# Patient Record
Sex: Female | Born: 2000 | Race: Black or African American | Hispanic: No | Marital: Single | Smoking: Never smoker
Health system: Southern US, Community
[De-identification: ages and names within clinical notes are randomized; demographics above are authoritative.]

## PROBLEM LIST (undated history)

## (undated) DIAGNOSIS — Z8489 Family history of other specified conditions: Secondary | ICD-10-CM

## (undated) DIAGNOSIS — D649 Anemia, unspecified: Secondary | ICD-10-CM

## (undated) DIAGNOSIS — R87629 Unspecified abnormal cytological findings in specimens from vagina: Secondary | ICD-10-CM

## (undated) DIAGNOSIS — L309 Dermatitis, unspecified: Secondary | ICD-10-CM

## (undated) DIAGNOSIS — Z87898 Personal history of other specified conditions: Secondary | ICD-10-CM

## (undated) DIAGNOSIS — D242 Benign neoplasm of left breast: Secondary | ICD-10-CM

## (undated) HISTORY — DX: Dermatitis, unspecified: L30.9

## (undated) HISTORY — DX: Anemia, unspecified: D64.9

## (undated) HISTORY — DX: Unspecified abnormal cytological findings in specimens from vagina: R87.629

---
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
## 2021-01-09 NOTE — Progress Notes (Signed)
Patient ID: CHANETTE DEMO, female   DOB: 2000-10-20, 20 y.o.   MRN: [id number] I have reviewed information documented by RN and was present in the office for consultation as indicated.  Agree with assessment and plan noted in record of visit. Lurlean Leyden, MD

## 2021-02-03 ENCOUNTER — Ambulatory Visit (INDEPENDENT_AMBULATORY_CARE_PROVIDER_SITE_OTHER): Payer: Medicaid Other | Admitting: Pediatrics

## 2021-02-03 ENCOUNTER — Other Ambulatory Visit: Payer: Self-pay

## 2021-02-03 ENCOUNTER — Encounter: Payer: Self-pay | Admitting: Pediatrics

## 2021-02-03 VITALS — Wt 105.0 lb

## 2021-02-03 DIAGNOSIS — N946 Dysmenorrhea, unspecified: Secondary | ICD-10-CM | POA: Diagnosis not present
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
## 2021-02-04 DIAGNOSIS — N946 Dysmenorrhea, unspecified: Secondary | ICD-10-CM | POA: Diagnosis not present

## 2021-02-04 NOTE — Patient Instructions (Signed)
I will contact you with test results

## 2021-02-07 DIAGNOSIS — N921 Excessive and frequent menstruation with irregular cycle: Secondary | ICD-10-CM

## 2021-02-07 LAB — WET PREP BY MOLECULAR PROBE
Candida species: NOT DETECTED
Gardnerella vaginalis: NOT DETECTED
MICRO NUMBER:: 12022144
SPECIMEN QUALITY:: ADEQUATE
Trichomonas vaginosis: NOT DETECTED

## 2021-02-07 LAB — C. TRACHOMATIS/N. GONORRHOEAE RNA
C. trachomatis RNA, TMA: NOT DETECTED
N. gonorrhoeae RNA, TMA: NOT DETECTED

## 2021-02-07 MED ORDER — AYGESTIN 5 MG PO TABS: ORAL_TABLET | ORAL | 0 refills | Status: DC

## 2021-02-10 ENCOUNTER — Ambulatory Visit (HOSPITAL_COMMUNITY)
Admission: EM | Admit: 2021-02-10 | Discharge: 2021-02-10 | Disposition: A | Discharge status: Home / Self Care | Payer: Medicaid Other | Attending: Family Medicine | Admitting: Family Medicine

## 2021-02-10 ENCOUNTER — Encounter (HOSPITAL_COMMUNITY): Payer: Self-pay

## 2021-02-10 ENCOUNTER — Other Ambulatory Visit: Payer: Self-pay

## 2021-02-10 DIAGNOSIS — N939 Abnormal uterine and vaginal bleeding, unspecified: Secondary | ICD-10-CM | POA: Insufficient documentation

## 2021-02-10 DIAGNOSIS — J039 Acute tonsillitis, unspecified: Secondary | ICD-10-CM | POA: Diagnosis not present

## 2021-02-10 LAB — POCT RAPID STREP A, ED / UC: Streptococcus, Group A Screen (Direct): NEGATIVE

## 2021-02-10 MED ORDER — NORETHINDRONE ACETATE 5 MG PO TABS: 5.0000 mg | ORAL_TABLET | Freq: Every day | ORAL | 3 refills | Status: DC

## 2021-02-10 MED ORDER — AMOXICILLIN 500 MG PO TABS: 500.0000 mg | ORAL_TABLET | Freq: Two times a day (BID) | ORAL | 0 refills | Status: AC

## 2021-02-10 MED ORDER — AMOXICILLIN 500 MG PO TABS: 500.0000 mg | ORAL_TABLET | Freq: Two times a day (BID) | ORAL | 0 refills | Status: DC

## 2021-02-10 NOTE — Discharge Instructions (Signed)
I have sent in amoxicillin for you to take twice a day for 7 days  I have given you a paper prescription for your birth control pills.  Follow up with this office or with primary care if symptoms are persisting.  Follow up in the ER for high fever, trouble swallowing, trouble breathing, other concerning symptoms.

## 2021-02-10 NOTE — ED Provider Notes (Signed)
[facility]   [id number] 02/10/21 Arrival Time: 1950  DT:OIZT THROAT  SUBJECTIVE: History from: patient.  [name] is a 20 y.o. female who presents with abrupt onset of sore throat for the last 2 days.  Denies sick exposure to Covid, strep, flu or mono, or precipitating event. Has tried motrin without relief. Has negative history of Covid. Has not completed Covid vaccines. Symptoms are made worse with swallowing, but tolerating liquids and own secretions without difficulty.  Denies previous symptoms in the past.  Denies fever, chills, fatigue, ear pain, sinus pain, rhinorrhea, nasal congestion, cough, SOB, wheezing, chest pain, nausea, rash, changes in bowel or bladder habits.    Of note, reports breakthrough bleeding on depo-provera, was given rx by PCP but pharmacy still does not have the script per patient  ROS: As per HPI.  All other pertinent ROS negative.     Past Medical History:  Diagnosis Date   Asthma    prn inhaler/neb.   Family history of adverse reaction to anesthesia    pt's mother has hx. of post-op N/V and hx. of being hard to wake up post-op   Fibroadenoma of breast, left    History of febrile seizure    Past Surgical History:  Procedure Laterality Date   MASS EXCISION Left 07/20/2016   Procedure: EXCISION left breast fibroadenoma;  Surgeon: Gerald Stabs, MD;  Location: [facility];  Service: General;  Laterality: Left;   Allergies  Allergen Reactions   Peanut-Containing Drug Products Hives and Shortness Of Breath   No current facility-administered medications on file prior to encounter.   Current Outpatient Medications on File Prior to Encounter  Medication Sig Dispense Refill   ALBUTEROL IN Inhale into the lungs.     Montelukast Sodium (SINGULAIR PO) Take by mouth.     Social History   Socioeconomic History   Marital status: Single    Spouse name: Not on file   Number of children: Not on file   Years of  education: Not on file   Highest education level: Not on file  Occupational History   Not on file  Tobacco Use   Smoking status: Never   Smokeless tobacco: Never  Vaping Use   Vaping Use: Never used  Substance and Sexual Activity   Alcohol use: No   Drug use: No   Sexual activity: Yes  Other Topics Concern   Not on file  Social History Narrative         Social Determinants of Health   Financial Resource Strain: Not on file  Food Insecurity: Not on file  Transportation Needs: Not on file  Physical Activity: Not on file  Stress: Not on file  Social Connections: Not on file  Intimate Partner Violence: Not on file   Family History  Problem Relation Age of Onset   Hypertension Mother    Asthma Mother    Anesthesia problems Mother        post-op N/V; hard to wake up post-op   Hypertension Maternal Uncle    Kidney disease Maternal Uncle        dialysis   Diabetes Maternal Grandmother    Heart disease Maternal Grandmother    Hypertension Maternal Grandmother    Kidney disease Maternal Grandmother        not yet on dialysis   Congestive Heart Failure Maternal Grandmother    Asthma Maternal Grandmother    Kidney disease Maternal Uncle     OBJECTIVE:  Vitals:  02/10/21 1358  BP: 119/71  Pulse: 77  Resp: 16  Temp: 99.1 F (37.3 C)  TempSrc: Oral  SpO2: 100%     General appearance: alert; appears fatigued, but nontoxic, speaking in full sentences and managing own secretions HEENT: NCAT; Ears: EACs clear, TMs pearly gray with visible cone of light, without erythema; Eyes: PERRL, EOMI grossly; Nose: no obvious rhinorrhea; Throat: oropharynx erythematous, tonsils 2+ and mildly erythematous with white tonsillar exudates, uvula midline Neck: supple with LAD Lungs: CTA bilaterally without adventitious breath sounds; cough absent Heart: regular rate and rhythm.  Radial pulses 2+ symmetrical bilaterally Skin: warm and dry Psychological: alert and cooperative; normal  mood and affect  LABS: Results for orders placed or performed during the hospital encounter of 02/10/21 (from the past 24 hour(s))  POCT Rapid Strep A     Status: None   Collection Time: 02/10/21  2:59 PM  Result Value Ref Range   Streptococcus, Group A Screen (Direct) NEGATIVE NEGATIVE     ASSESSMENT & PLAN:  1. Acute tonsillitis, unspecified etiology   2. Abnormal vaginal bleeding     Meds ordered this encounter  Medications   norethindrone (AYGESTIN) 5 MG tablet    Sig: Take 1 tablet (5 mg total) by mouth daily for 28 days.    Dispense:  28 tablet    Refill:  3    Order Specific Question:   Supervising Provider    Answer:   Chase Picket [[id number]]   DISCONTD: amoxicillin (AMOXIL) 500 MG tablet    Sig: Take 1 tablet (500 mg total) by mouth 2 (two) times daily for 10 days.    Dispense:  20 tablet    Refill:  0    Order Specific Question:   Supervising Provider    Answer:   Chase Picket [[id number]]   amoxicillin (AMOXIL) 500 MG tablet    Sig: Take 1 tablet (500 mg total) by mouth 2 (two) times daily for 10 days.    Dispense:  20 tablet    Refill:  0    Order Specific Question:   Supervising Provider    Answer:   Chase Picket [id number]   Strep test negative, will send out for culture and we will call you with results Will cover for tonsillitis with amoxicillin 500mg  BID x 10 days given clinical presentation Declines test for mono at this time Get plenty of rest and push fluids Take OTC Zyrtec and use chloraseptic spray as needed for throat pain. Drink warm or cool liquids, use throat lozenges, or popsicles to help alleviate symptoms Take OTC ibuprofen or tylenol as needed for pain Follow up with PCP if symptoms persists Return or go to ER if patient has any new or worsening symptoms such as fever, chills, nausea, vomiting, worsening sore throat, cough, abdominal pain, chest pain, changes in bowel or bladder habits Provided paper script for OCPs since she has  not yet been able to obtain these Reviewed expectations re: course of current medical issues. Questions answered. Outlined signs and symptoms indicating need for more acute intervention. Patient verbalized understanding. After Visit Summary given.           Faustino Congress, NP 02/10/21 1537

## 2021-02-10 NOTE — ED Triage Notes (Signed)
Patient presents to Urgent Care with complaints of sore throat, abdominal pain since last Tuesday. Treating symptoms with motrin and cough med.   Denies fever.

## 2021-02-13 LAB — CULTURE, GROUP A STREP (THRC)

## 2021-02-23 DIAGNOSIS — F4321 Adjustment disorder with depressed mood: Secondary | ICD-10-CM | POA: Diagnosis not present

## 2021-03-22 DIAGNOSIS — F4321 Adjustment disorder with depressed mood: Secondary | ICD-10-CM | POA: Diagnosis not present

## 2021-03-30 DIAGNOSIS — F4321 Adjustment disorder with depressed mood: Secondary | ICD-10-CM | POA: Diagnosis not present

## 2021-03-30 NOTE — Progress Notes (Signed)
Adolescent Well Care Visit [name] is a 20 y.o. female who is here for well care.    PCP:  Lurlean Leyden, MD   History was provided by the patient.  Confidentiality was discussed with the patient and, if applicable, with caregiver as well. Patient's personal or confidential phone number: [phone]   Current Issues: Current concerns include  Chief Complaint  Patient presents with   Well Child    She doesn't want depo shot she wants some thing else.  irregular bleeding,   Medication Refill    Eczema cream, refill on the norethrindrone   Based on conversation with Dr. Dorothyann Peng in June 2022, she would like the implant.  Will refer her to adolescent medicine Wants to stop Depo-provera.  Bleeding 3-4 months straight which is why she wants a different form of birth control .   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 They had been together 2 years.  She is in counseling.  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  no Stressors of note: yes - loss of her boyfriend of 2 years  Education: School Name: [employer] performance: doing well; no concerns  Menstruation:   Patient's last menstrual period was 03/30/2021. Menstrual History: Heavy menses  Confidential Social History: Tobacco?  yes Secondhand  smoke exposure?  no Drugs/ETOH?  no  Sexually Active?  yes   Pregnancy Prevention: Discussed  Safe at home, in school & in relationships?  Yes Safe to self?  Yes   Screenings: Patient has a dental home: yes  The patient completed the Rapid Assessment for Adolescent Preventive Services screening questionnaire and the following topics were identified as risk factors and discussed: healthy eating, exercise, seatbelt use, weapon use, tobacco use, marijuana use, drug use, birth control, and mental health issues  In addition, the following topics were discussed as part of anticipatory guidance birth control.  PHQ-9 completed and results indicated see screening tab   Physical Exam:  Vitals:   03/31/21 1501  BP: 110/64  Pulse: 71  SpO2: 97%  Weight: 106 lb 9.6 oz (48.4 kg)  Height: 5' 0.32" (1.532 m)   BP 110/64 (BP Location: Right Arm, Patient Position: Sitting, Cuff Size: Small)   Pulse 71   Ht 5' 0.32" (1.532 m)   Wt 106 lb 9.6 oz (48.4 kg)   LMP 03/30/2021 Comment: she has irregular bleeding  SpO2 97%   BMI 20.60 kg/m  Body mass index: body mass index is 20.6 kg/m. Blood pressure percentiles are not available for patients who are 18 years or older.  Hearing Screening  Method: Audiometry   '500Hz'$  '1000Hz'$  '2000Hz'$  '4000Hz'$   Right ear '20 20 20 20  '$ Left ear '20 20 20 20   '$ Vision Screening   Right eye Left eye Both eyes  Without correction     With correction 2016  20/16 20/16    General Appearance:   alert, oriented, no acute distress and slender  HENT: Normocephalic, no obvious abnormality, conjunctiva clear  Mouth:   Normal appearing teeth, no obvious discoloration, dental caries, or dental caps  Neck:   Supple; thyroid: no enlargement, symmetric, no tenderness/mass/nodules     Lungs:   Clear to auscultation bilaterally, normal work of breathing  Heart:   Regular rate and rhythm, S1 and S2 normal, no murmurs;   Abdomen:   Soft, non-tender, no mass, or organomegaly  GU  normal female external genitalia, pelvic not performed  Musculoskeletal:   Tone and strength strong and symmetrical, all extremities               Lymphatic:   No cervical adenopathy  Skin/Hair/Nails:   Skin warm, dry and intact, no rashes, no bruises or petechiae  Neurologic:   Strength, gait, and coordination normal and age-appropriate CN II -XII grossly intact     Assessment and Plan:   1. Encounter for general adult medical examination with abnormal findings -In counseling after boyfriend of 2 years' recent death in [place]  - Discussion about adolescent transition to adult practice.  Based on information today we did not complete the adolescent transition assessment as she had too many other pressing issues to discuss. We agreed that she would follow up with Dr. Dorothyann Peng in ~ 3 months and that could be one issue that they discuss.  With referral to Adolescent medicine, she could be referred to adult practice when appropriate and still be followed for contraception with adolescent med should she chose that option.    2. Body mass index, pediatric, 5th percentile to less than 85th percentile for age 5 regarding 5-2-1-0 goals of healthy active living including:  - eating at least 5 fruits and vegetables a day - at least 1 hour of activity - no sugary beverages - eating three meals each day with age-appropriate servings - age-appropriate screen time - age-appropriate sleep patterns    3. Screening examination for venereal disease - Urine cytology ancillary only - pending  > 20 minutes spent talking about # 4 and options for birth control, heavy menses, adolescent transition, grief with boyfriend's recent death and stressors associated with cosmetology school.   4. Breakthrough bleeding on depo provera Reporting heavy bleeding for weeks on Depo provera and therefore has decided not to continue with Depo.  She would like to get the implant after speaking with Dr. Dorothyann Peng in June  2022.  No referral made at that time so will make referral today.  Discussed options until able to see adolescent med.  She did not want a depo shot today.  She declined other possible birth control such as OCP's.  She said she just planned to abstain.  Discussed Plan B if needed.  She is aware that likely will not get an appt before late Sept with Adolescent Med - Ambulatory referral to Adolescent Medicine  Vaccine:  She has not had the HPV vaccine.  Discussed the vaccine, what it protects against and that she would receive a series of 3 vaccines, since starting after age 20 years.  She would like to think about it and follow up in the future.   BMI is appropriate for age  Hearing screening result:normal Vision screening result: normal  Counseling provided for all of the vaccine components  Orders Placed This Encounter  Procedures   POCT urine pregnancy     No follow-ups on file.Mickel Baas  Magdalen Spatz, NP

## 2021-03-31 ENCOUNTER — Encounter: Payer: Self-pay | Admitting: Pediatrics

## 2021-03-31 ENCOUNTER — Other Ambulatory Visit (HOSPITAL_COMMUNITY)
Admission: RE | Admit: 2021-03-31 | Discharge: 2021-03-31 | Disposition: A | Discharge status: Home / Self Care | Payer: Medicaid Other | Source: Ambulatory Visit | Attending: Pediatrics | Admitting: Pediatrics

## 2021-03-31 ENCOUNTER — Ambulatory Visit (INDEPENDENT_AMBULATORY_CARE_PROVIDER_SITE_OTHER): Payer: Medicaid Other | Admitting: Pediatrics

## 2021-03-31 ENCOUNTER — Other Ambulatory Visit: Payer: Self-pay

## 2021-03-31 VITALS — BP 110/64 | HR 71 | Ht 60.32 in | Wt 106.6 lb

## 2021-03-31 DIAGNOSIS — Z113 Encounter for screening for infections with a predominantly sexual mode of transmission: Secondary | ICD-10-CM | POA: Insufficient documentation

## 2021-03-31 DIAGNOSIS — Z68.41 Body mass index (BMI) pediatric, 5th percentile to less than 85th percentile for age: Secondary | ICD-10-CM | POA: Diagnosis not present

## 2021-03-31 DIAGNOSIS — N921 Excessive and frequent menstruation with irregular cycle: Secondary | ICD-10-CM | POA: Diagnosis not present

## 2021-03-31 DIAGNOSIS — Z0001 Encounter for general adult medical examination with abnormal findings: Secondary | ICD-10-CM

## 2021-03-31 DIAGNOSIS — Z3042 Encounter for surveillance of injectable contraceptive: Secondary | ICD-10-CM

## 2021-03-31 NOTE — Patient Instructions (Addendum)
Referral to adolescent Medicine for Implant    Appointments Call the main number [phone] before going to the Emergency Department unless it's a true emergency.  For a true emergency, go to the [facility] Emergency Department.    When the clinic is closed, a nurse always answers the main number [phone] and a doctor is always available.   Clinic is open for sick visits only on Saturday mornings from 8:30AM to 12:30PM. Call first thing on Saturday morning for an appointment.   Vaccine fevers - Fevers with most vaccines begin within 12 hours and may last 2?3 days.  You may give tylenol at least 4 hours after the vaccine dose if the child is feverish or fussy or motrin after 1 months of age - Fever is normal and harmless as the body develops an immune response to the vaccine - It means the vaccine is working building antibodies. - Fevers 72 hours after a vaccine warrant the child being seen or calling our office to speak with a nurse. -Rash after vaccine, can happen with the measles, mumps, rubella and varicella (chickenpox) vaccine anytime 1-4 weeks after the vaccine, this is an expected response.  -A firm lump at the injection site can happen and usually goes away in 4-8 weeks.  Warm compresses may help.  Poison Control Number [phone] 152-1920  Water safety (Pediatrics.2019): -highest drowning risk is in toddlers - female and teen boys -constant and reliable adult supervision around water -children 4 and younger need to be supervised around pools, bath time, buckets and toilet use due to high risk for drowning. -pool isolation fencing -children with seizure disorders have up to 10 times the risk of drowning and should have constant supervision around water (swim where lifeguards) -children with autism spectrum disorder under age 59 also have high risk for drowning -encourage swim lessons, and proper use of floatation devices such as life jacket use to help prevent drowning.  Teenagers need at  least 1300 mg of calcium per day, as they have to store calcium in bone for the future.  And they need at least 1000 IU of vitamin D3.every day.    Good food sources of calcium are dairy (yogurt, cheese, milk), orange juice with added calcium and vitamin D3, and dark leafy greens.  Taking two extra strength Tums with meals gives a good amount of calcium.     It's hard to get enough vitamin D3 from food, but orange juice, with added calcium and vitamin D3, helps.  A daily dose of 20-30 minutes of sunlight also helps.     The easiest way to get enough vitamin D3 is to take a supplement.  It's easy and inexpensive.  Teenagers need at least 1000 IU per day.  The current "American Academy of Pediatrics' guidelines for adolescents" say "no more than 100 mg of caffeine per day, or roughly the amount in a typical cup of coffee." But, "energy drinks are manufactured in adult serving sizes," children can exceed those recommendations.    According to the [place] should be getting the following amount of sleep nightly Infants 4 to 12 months - 12 to 16 hours (including naps) Toddlers 1 to 2 years - 11 to 14 hours (including naps) 54- to 58-year-old children - 10 to 13 hours (including naps) 64- to 16 year old children - 9 to 12 hours Teens 13 to 18 years - 8 to 10 hours  Vaping: Not recommended and here are the reasons why; four hazardous chemicals in nearly  all of them: Nicotine is an addictive stimulant. It causes a rush of adrenaline, a sudden release of glucose and increases blood pressure, heart rate and respiration. Because a young person's brain is not fully developed, nicotine can also cause long-lasting effects such as mood disorders, a permanent lowering of impulse control as well as harming parts of the brain that control attention and learning. Diacetyl is a chemical used to provide a butter-like flavoring, most notably in microwave popcorn. This chemical is used in  flavoring the juice. Although diacetyl is safe to eat, its vapor has been linked to a lung disease called obliterative bronchiolitis, also known as popcorn lung, which damages the lung's smallest airways, causing coughing and shortness of breath. There is no cure for popcorn lung. Volatile organic compounds (VOCs) are most often found in household products, such as cleaners, paints, varnishes, disinfectants, pesticides and stored fuels. Overexposure to these chemicals can cause headaches, nausea, fatigue, dizziness and memory impairment. Cancer-causing chemicals such as heavy metals, including nickel, tin and lead, formaldehyde and other ultrafine particles are typically found in vape juice.  Adolescent nicotine cessation:  www.smokefree.gov  and 1-800-QUIT-NOW  Resources: Ways to enhance children's activity and nutrition (WE CAN)   https://johnson-smith.net/  My Pyramid     GeneBlogs.si     Nutrition, what to eat/portion sizes.  KidsHealth.org   https://kidshealth.org    Normal growth and development of children and how the body works  [employer] line to connect residents by phone with mental health support programs  [phone]

## 2021-04-01 LAB — URINE CYTOLOGY ANCILLARY ONLY
Chlamydia: NEGATIVE
Comment: NEGATIVE
Comment: NORMAL
Neisseria Gonorrhea: NEGATIVE

## 2021-04-19 DIAGNOSIS — F4321 Adjustment disorder with depressed mood: Secondary | ICD-10-CM | POA: Diagnosis not present

## 2021-05-06 ENCOUNTER — Encounter (HOSPITAL_COMMUNITY): Payer: Self-pay | Admitting: Emergency Medicine

## 2021-05-06 ENCOUNTER — Emergency Department (HOSPITAL_COMMUNITY)
Admission: EM | Admit: 2021-05-06 | Discharge: 2021-05-07 | Disposition: A | Discharge status: Home / Self Care | Payer: Medicaid Other | Attending: Emergency Medicine | Admitting: Emergency Medicine

## 2021-05-06 ENCOUNTER — Other Ambulatory Visit: Payer: Self-pay

## 2021-05-06 DIAGNOSIS — J4520 Mild intermittent asthma, uncomplicated: Secondary | ICD-10-CM | POA: Diagnosis not present

## 2021-05-06 DIAGNOSIS — Y92410 Unspecified street and highway as the place of occurrence of the external cause: Secondary | ICD-10-CM | POA: Insufficient documentation

## 2021-05-06 DIAGNOSIS — M4184 Other forms of scoliosis, thoracic region: Secondary | ICD-10-CM | POA: Diagnosis not present

## 2021-05-06 DIAGNOSIS — Z91010 Allergy to peanuts: Secondary | ICD-10-CM | POA: Insufficient documentation

## 2021-05-06 DIAGNOSIS — M546 Pain in thoracic spine: Secondary | ICD-10-CM | POA: Diagnosis not present

## 2021-05-06 DIAGNOSIS — Z041 Encounter for examination and observation following transport accident: Secondary | ICD-10-CM | POA: Diagnosis not present

## 2021-05-06 DIAGNOSIS — Z7951 Long term (current) use of inhaled steroids: Secondary | ICD-10-CM | POA: Diagnosis not present

## 2021-05-06 DIAGNOSIS — M545 Low back pain, unspecified: Secondary | ICD-10-CM | POA: Diagnosis not present
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
## 2021-05-07 ENCOUNTER — Emergency Department (HOSPITAL_COMMUNITY): Payer: Medicaid Other

## 2021-05-07 DIAGNOSIS — Z041 Encounter for examination and observation following transport accident: Secondary | ICD-10-CM | POA: Diagnosis not present

## 2021-05-07 DIAGNOSIS — M4184 Other forms of scoliosis, thoracic region: Secondary | ICD-10-CM | POA: Diagnosis not present

## 2021-05-07 LAB — PREGNANCY, URINE: Preg Test, Ur: NEGATIVE

## 2021-05-07 MED ORDER — METHOCARBAMOL 500 MG PO TABS: 500.0000 mg | ORAL_TABLET | Freq: Three times a day (TID) | ORAL | 0 refills | Status: DC | PRN

## 2021-05-07 MED ORDER — NAPROXEN 500 MG PO TABS: 500.0000 mg | ORAL_TABLET | Freq: Two times a day (BID) | ORAL | 0 refills | Status: DC | PRN
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
## 2021-05-08 ENCOUNTER — Telehealth: Payer: Self-pay

## 2021-05-08 NOTE — Telephone Encounter (Signed)
Transition Care Management Follow-up Telephone Call Date of discharge and from where: 05/07/2021 from [facility] How have you been since you were released from the hospital? Pt stated that she is feeling some better. Pt was not aware of any rx's. Informed pt they were printed and should have been in the paper work that was given to her.  Any questions or concerns? No  Items Reviewed: Did the pt receive and understand the discharge instructions provided? Yes  Medications obtained and verified? Yes  Other? No  Any new allergies since your discharge? No  Dietary orders reviewed? No Do you have support at home? Yes   Functional Questionnaire: (I = Independent and D = Dependent) ADLs: I  Bathing/Dressing- I  Meal Prep- I  Eating- I  Maintaining continence- I  Transferring/Ambulation- I  Managing Meds- I   Follow up appointments reviewed:  PCP Hospital f/u appt confirmed? Yes  Scheduled to see Hoyt Koch, NP on 05/10/2021 @ 3:00pm. [facility] f/u appt confirmed? No   Are transportation arrangements needed? No  If their condition worsens, is the pt aware to call PCP or go to the Emergency Dept.? Yes Was the patient provided with contact information for the PCP's office or ED? Yes Was to pt encouraged to call back with questions or concerns? Yes

## 2021-05-10 ENCOUNTER — Ambulatory Visit (INDEPENDENT_AMBULATORY_CARE_PROVIDER_SITE_OTHER): Payer: Medicaid Other | Admitting: Family

## 2021-05-10 ENCOUNTER — Other Ambulatory Visit (HOSPITAL_COMMUNITY)
Admission: RE | Admit: 2021-05-10 | Discharge: 2021-05-10 | Disposition: A | Discharge status: Home / Self Care | Payer: Medicaid Other | Source: Ambulatory Visit | Attending: Family | Admitting: Family

## 2021-05-10 VITALS — BP 103/65 | HR 70 | Ht 59.45 in | Wt 103.6 lb

## 2021-05-10 DIAGNOSIS — Z113 Encounter for screening for infections with a predominantly sexual mode of transmission: Secondary | ICD-10-CM | POA: Diagnosis not present

## 2021-05-10 DIAGNOSIS — N3000 Acute cystitis without hematuria: Secondary | ICD-10-CM | POA: Diagnosis not present

## 2021-05-10 DIAGNOSIS — Z3202 Encounter for pregnancy test, result negative: Secondary | ICD-10-CM | POA: Diagnosis not present

## 2021-05-10 DIAGNOSIS — N921 Excessive and frequent menstruation with irregular cycle: Secondary | ICD-10-CM | POA: Diagnosis not present

## 2021-05-10 DIAGNOSIS — R82998 Other abnormal findings in urine: Secondary | ICD-10-CM | POA: Diagnosis not present

## 2021-05-10 DIAGNOSIS — Z1389 Encounter for screening for other disorder: Secondary | ICD-10-CM | POA: Diagnosis not present

## 2021-05-10 LAB — POCT URINALYSIS DIPSTICK
Bilirubin, UA: POSITIVE
Blood, UA: POSITIVE
Glucose, UA: NEGATIVE
Ketones, UA: 1.5
Nitrite, UA: POSITIVE
Protein, UA: POSITIVE — AB
Spec Grav, UA: 1.015 (ref 1.010–1.025)
Urobilinogen, UA: 0.2 E.U./dL
pH, UA: 5 (ref 5.0–8.0)

## 2021-05-10 LAB — POCT URINE PREGNANCY: Preg Test, Ur: NEGATIVE

## 2021-05-10 MED ORDER — ETONOGESTREL-ETHINYL ESTRADIOL 0.12-0.015 MG/24HR VA RING: VAGINAL_RING | VAGINAL | 3 refills | Status: DC

## 2021-05-10 MED ORDER — BENZONATATE 200 MG PO CAPS: 200.0000 mg | ORAL_CAPSULE | Freq: Three times a day (TID) | ORAL | 0 refills | Status: DC | PRN

## 2021-05-10 MED ORDER — NITROFURANTOIN MONOHYD MACRO 100 MG PO CAPS: 100.0000 mg | ORAL_CAPSULE | Freq: Two times a day (BID) | ORAL | 0 refills | Status: DC

## 2021-05-10 MED ORDER — NORELGESTROMIN-ETH ESTRADIOL 150-35 MCG/24HR TD PTWK: 1.0000 | MEDICATED_PATCH | TRANSDERMAL | 3 refills | Status: DC

## 2021-05-10 NOTE — Progress Notes (Signed)
History was provided by the patient.  [name] is a 20 y.o. female who is here for breakthrough bleeding on depo.  PCP confirmed? Yes.    Courtney Leyden, MD  HPI:   -last year started birth control before her birthday; was in [place] for college  -got depo shot and was bleeding 2-3 month, then would stop for 2-4 weeks and then would start again; the whole time on depo  -works for birth control but annoying because of tampon costs, etc  -works as Theatre manager; on her feet a lot and with bleeding feels drained  -sexually active with female partner  -noticed urine smell was strong, has been drinking water/fluids to help  -no pain with intercourse  -LMP: bleeding now; has been bleeding since beginning of September (3rd or 4th)  -getting over a cold, has allergies - usually starts to get symptoms of cold  -has been taking Nyquil pills and Dayquil pills; beige sputum; negative covid test     Patient Active Problem List   Diagnosis Date Noted   Encounter for management and injection of depo-Provera 06/02/2020   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     methocarbamol (ROBAXIN) 500 MG tablet Take 1 tablet (500 mg total) by mouth every 8 (eight) hours as needed for muscle spasms. (Patient not taking: Reported on 05/10/2021) 15 tablet 0   naproxen (NAPROSYN) 500 MG tablet Take 1 tablet (500 mg total) by mouth 2 (two) times daily as needed for moderate pain. (Patient not taking:  Reported on 05/10/2021) 15 tablet 0   norethindrone (AYGESTIN) 5 MG tablet Take 1 tablet (5 mg total) by mouth daily for 28 days. 28 tablet 3   No current facility-administered medications on file prior to visit.    Allergies  Allergen Reactions   Peanut Allergen Powder-Dnfp Anaphylaxis   Peanut-Containing Drug Products Hives and Shortness Of Breath    Physical Exam:    Vitals:   05/10/21 1507  BP: 103/65  Pulse: 70  Weight: 103 lb 9.6 oz (47 kg)  Height: 4' 11.45" (1.51 m)   Wt Readings from Last 3 Encounters:  05/10/21 103 lb 9.6 oz (47 kg) (7 %, Z= -1.45)*  03/31/21 106 lb 9.6 oz (48.4 kg) (12 %, Z= -1.20)*  02/03/21 105 lb (47.6 kg) (10 %, Z= -1.31)*   * Growth percentiles are based on CDC (Girls, 2-20 Years) data.    Blood pressure percentiles are not available for patients who are 18 years or older. No LMP recorded.  Physical Exam Vitals reviewed.  Constitutional:      General: She is not in acute distress. HENT:     Head: Normocephalic.     Mouth/Throat:     Pharynx: Oropharynx is clear.  Eyes:     General: No scleral icterus.    Extraocular Movements: Extraocular movements intact.     Pupils: Pupils are equal, round, and reactive to light.  Cardiovascular:     Rate and Rhythm: Normal rate and regular rhythm.  Heart sounds: No murmur heard. Pulmonary:     Effort: Pulmonary effort is normal.  Abdominal:     General: Abdomen is flat. There is no distension.     Palpations: Abdomen is soft.     Tenderness: There is no abdominal tenderness.  Musculoskeletal:        General: No swelling. Normal range of motion.     Cervical back: Normal range of motion.  Lymphadenopathy:     Cervical: No cervical adenopathy.  Skin:    General: Skin is warm and dry.     Findings: No rash.  Neurological:     General: No focal deficit present.     Mental Status: She is alert and oriented to person, place, and time.  Psychiatric:        Mood and Affect: Mood normal.      Assessment/Plan: [name] is a 20 yo female presenting with c/o breakthrough bleeding with Depo and desire to switch methods; urine dip today +nitrites, +leuks, +blood. Discussed will treat with Macrobid x 5 days; will screen for STIs. Reviewed options including IUD, implant, depo, pill, patch, ring. She would like to try patches; no contraindications to estrogen use. Return in 8 weeks or sooner as needed. Will call with results. Tesselon perrles for cough. Return precautions given.

## 2021-05-10 NOTE — Patient Instructions (Signed)
Let me know how you are feeling.  If new or worsening symptoms, please reach out.  I will see you in 8 weeks or sooner if needed!  It was nice to see you today!  [employer]

## 2021-05-11 ENCOUNTER — Encounter: Payer: Self-pay | Admitting: Family

## 2021-05-12 LAB — URINE CYTOLOGY ANCILLARY ONLY
Bacterial Vaginitis-Urine: NEGATIVE
Candida Urine: NEGATIVE
Chlamydia: NEGATIVE
Comment: NEGATIVE
Comment: NEGATIVE
Comment: NORMAL
Neisseria Gonorrhea: NEGATIVE
Trichomonas: NEGATIVE

## 2021-05-13 ENCOUNTER — Encounter: Payer: Self-pay | Admitting: Family

## 2021-05-13 LAB — URINE CULTURE
MICRO NUMBER:: 12398035
SPECIMEN QUALITY:: ADEQUATE

## 2021-05-30 ENCOUNTER — Ambulatory Visit (INDEPENDENT_AMBULATORY_CARE_PROVIDER_SITE_OTHER): Payer: Medicaid Other

## 2021-05-30 ENCOUNTER — Other Ambulatory Visit (HOSPITAL_COMMUNITY)
Admission: RE | Admit: 2021-05-30 | Discharge: 2021-05-30 | Disposition: A | Discharge status: Home / Self Care | Payer: Medicaid Other | Source: Ambulatory Visit | Attending: Family | Admitting: Family

## 2021-05-30 ENCOUNTER — Other Ambulatory Visit: Payer: Self-pay

## 2021-05-30 DIAGNOSIS — Z113 Encounter for screening for infections with a predominantly sexual mode of transmission: Secondary | ICD-10-CM | POA: Insufficient documentation

## 2021-05-30 DIAGNOSIS — Z3202 Encounter for pregnancy test, result negative: Secondary | ICD-10-CM

## 2021-05-30 DIAGNOSIS — Z1389 Encounter for screening for other disorder: Secondary | ICD-10-CM

## 2021-05-30 LAB — POCT URINALYSIS DIPSTICK
Bilirubin, UA: NEGATIVE
Blood, UA: NEGATIVE
Glucose, UA: NEGATIVE
Ketones, UA: NEGATIVE
Leukocytes, UA: NEGATIVE
Nitrite, UA: NEGATIVE
Protein, UA: NEGATIVE
Spec Grav, UA: 1.015 (ref 1.010–1.025)
Urobilinogen, UA: NEGATIVE E.U./dL — AB
pH, UA: 5 (ref 5.0–8.0)

## 2021-05-30 LAB — POCT URINE PREGNANCY: Preg Test, Ur: NEGATIVE

## 2021-05-30 NOTE — Progress Notes (Signed)
Pt here today for STI check after bleeding on patch. Screenings obtained. Will call or MyChart if labs come back abnormal.

## 2021-05-31 LAB — WET PREP BY MOLECULAR PROBE
Candida species: NOT DETECTED
MICRO NUMBER:: 12481866
SPECIMEN QUALITY:: ADEQUATE
Trichomonas vaginosis: NOT DETECTED

## 2021-06-01 ENCOUNTER — Other Ambulatory Visit: Payer: Self-pay | Admitting: Pediatrics

## 2021-06-01 LAB — URINE CYTOLOGY ANCILLARY ONLY
Chlamydia: NEGATIVE
Comment: NEGATIVE
Comment: NORMAL
Neisseria Gonorrhea: NEGATIVE

## 2021-06-01 MED ORDER — METRONIDAZOLE 500 MG PO TABS: 500.0000 mg | ORAL_TABLET | Freq: Two times a day (BID) | ORAL | 0 refills | Status: AC

## 2021-07-04 ENCOUNTER — Ambulatory Visit: Payer: Medicaid Other | Admitting: Pediatrics

## 2021-07-05 ENCOUNTER — Ambulatory Visit (INDEPENDENT_AMBULATORY_CARE_PROVIDER_SITE_OTHER): Payer: Medicaid Other | Admitting: Family

## 2021-07-05 ENCOUNTER — Encounter: Payer: Self-pay | Admitting: Family

## 2021-07-05 ENCOUNTER — Other Ambulatory Visit: Payer: Self-pay

## 2021-07-05 VITALS — BP 104/64 | HR 86 | Ht 60.24 in | Wt 105.4 lb

## 2021-07-05 DIAGNOSIS — N946 Dysmenorrhea, unspecified: Secondary | ICD-10-CM

## 2021-07-05 DIAGNOSIS — Z789 Other specified health status: Secondary | ICD-10-CM | POA: Diagnosis not present

## 2021-07-05 MED ORDER — NORELGESTROMIN-ETH ESTRADIOL 150-35 MCG/24HR TD PTWK: 1.0000 | MEDICATED_PATCH | TRANSDERMAL | 3 refills | Status: DC

## 2021-07-05 NOTE — Progress Notes (Signed)
History was provided by the patient.  [name] is a 20 y.o. female who is here for patch control maintenance.   PCP confirmed? Yes.    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     norelgestromin-ethinyl estradiol (ORTHO EVRA) 150-35 MCG/24HR transdermal patch Place 1 patch onto the skin once a week. 12 patch 3   benzonatate (TESSALON) 200 MG capsule Take 1 capsule (200 mg total) by mouth 3 (three) times daily as needed for cough. (Patient not taking: Reported on 07/05/2021) 15 capsule 0   methocarbamol (ROBAXIN) 500 MG tablet Take 1 tablet (500 mg total) by mouth every 8 (eight) hours as needed for muscle spasms. (Patient not taking: No sig reported) 15 tablet 0   naproxen (NAPROSYN) 500 MG tablet Take 1 tablet (500 mg total) by mouth 2 (two) times daily as needed for moderate pain. (Patient not taking: No sig reported) 15 tablet 0   nitrofurantoin, macrocrystal-monohydrate, (MACROBID) 100 MG capsule Take 1 capsule (100 mg total) by mouth 2 (two) times daily. (Patient not  taking: Reported on 07/05/2021) 10 capsule 0   norethindrone (AYGESTIN) 5 MG tablet Take 1 tablet (5 mg total) by mouth daily for 28 days. 28 tablet 3   No current facility-administered medications on file prior to visit.    Allergies  Allergen Reactions   Peanut Allergen Powder-Dnfp Anaphylaxis   Peanut-Containing Drug Products Hives and Shortness Of Breath    Physical Exam:    Vitals:   07/05/21 1505  BP: 104/64  Pulse: 86  Weight: 105 lb 6.4 oz (47.8 kg)  Height: 5' 0.24" (1.53 m)   Wt Readings from Last 3 Encounters:  07/05/21 105 lb 6.4 oz (47.8 kg) (9 %, Z= -1.32)*  05/10/21 103 lb 9.6 oz (47 kg) (7 %, Z= -1.45)*  03/31/21 106 lb 9.6 oz (48.4 kg) (12 %, Z= -1.20)*   * Growth percentiles are based on CDC (Girls, 2-20 Years) data.     Blood pressure percentiles are not available for patients who are 18 years or older. No LMP recorded.  Physical Exam Constitutional:      Appearance: Normal appearance.  HENT:     Head: Normocephalic.     Mouth/Throat:     Pharynx: Oropharynx is clear.  Eyes:     Extraocular Movements: Extraocular movements intact.     Pupils: Pupils are equal, round, and reactive to light.  Neck:     Thyroid: No thyromegaly.  Cardiovascular:     Rate and Rhythm: Normal rate and regular rhythm.     Heart sounds: No murmur heard.  Pulmonary:     Effort: Pulmonary effort is normal.  Musculoskeletal:        General: No swelling. Normal range of motion.     Cervical back: Normal range of motion and neck supple.  Skin:    General: Skin is warm.     Capillary Refill: Capillary refill takes less than 2 seconds.     Findings: No rash.  Neurological:     General: No focal deficit present.     Mental Status: She is alert and oriented to person, place, and time.  Psychiatric:        Mood and Affect: Mood normal.     Assessment/Plan: 1. Dysmenorrhea 2. Uses hormonal contraceptive patch as primary birth control method -reviewed patch use -no  adverse effects and dysmenorrhea and breakthrough bleeding has resolved -continue with method; Rx sent.  -return in 6 months or sooner if concerns

## 2021-07-06 ENCOUNTER — Encounter: Payer: Self-pay | Admitting: Family

## 2021-11-04 ENCOUNTER — Encounter: Payer: Self-pay | Admitting: Family

## 2021-11-07 ENCOUNTER — Encounter: Payer: Self-pay | Admitting: Family

## 2021-11-07 ENCOUNTER — Ambulatory Visit (INDEPENDENT_AMBULATORY_CARE_PROVIDER_SITE_OTHER): Payer: Medicaid Other | Admitting: Family

## 2021-11-07 ENCOUNTER — Other Ambulatory Visit: Payer: Self-pay

## 2021-11-07 ENCOUNTER — Other Ambulatory Visit (HOSPITAL_COMMUNITY)
Admission: RE | Admit: 2021-11-07 | Discharge: 2021-11-07 | Disposition: A | Discharge status: Home / Self Care | Payer: Medicaid Other | Source: Ambulatory Visit | Attending: Family | Admitting: Family

## 2021-11-07 VITALS — BP 103/60 | HR 74 | Ht 60.63 in | Wt 107.0 lb

## 2021-11-07 DIAGNOSIS — R8290 Unspecified abnormal findings in urine: Secondary | ICD-10-CM

## 2021-11-07 DIAGNOSIS — Z113 Encounter for screening for infections with a predominantly sexual mode of transmission: Secondary | ICD-10-CM

## 2021-11-07 DIAGNOSIS — Z789 Other specified health status: Secondary | ICD-10-CM

## 2021-11-07 DIAGNOSIS — Z3202 Encounter for pregnancy test, result negative: Secondary | ICD-10-CM

## 2021-11-07 DIAGNOSIS — N898 Other specified noninflammatory disorders of vagina: Secondary | ICD-10-CM

## 2021-11-07 LAB — POCT URINALYSIS DIPSTICK
Bilirubin, UA: NEGATIVE
Glucose, UA: NEGATIVE
Ketones, UA: NEGATIVE
Leukocytes, UA: NEGATIVE
Nitrite, UA: NEGATIVE
Protein, UA: NEGATIVE
Spec Grav, UA: 1.02 (ref 1.010–1.025)
Urobilinogen, UA: NEGATIVE E.U./dL — AB
pH, UA: 5 (ref 5.0–8.0)

## 2021-11-07 LAB — POCT URINE PREGNANCY: Preg Test, Ur: NEGATIVE

## 2021-11-07 MED ORDER — NORELGESTROMIN-ETH ESTRADIOL 150-35 MCG/24HR TD PTWK: 1.0000 | MEDICATED_PATCH | TRANSDERMAL | 3 refills | Status: DC

## 2021-11-07 NOTE — Progress Notes (Signed)
History was provided by the patient. ? ?[name] is a 21 y.o. female who is here for vaginal itching, vaginal discharge.  ? ?PCP confirmed? Yes.   ? Lurlean Leyden, MD ? ? ?Plan from last visit:  ?1. Dysmenorrhea ?2. Uses hormonal contraceptive patch as primary birth control method ?-reviewed patch use ?-no adverse effects and dysmenorrhea and breakthrough bleeding has resolved ?-continue with method; Rx sent.  ?-return in 6 months or sooner if concerns  ? ?Chart Review:  ?+BV on 05/30/21 ?- gc/c on 05/30/21 ? ?HPI:  ?-itching on outside, last shave was 2/14; no lesions  ?-feels like a yeast infection - no burning; on and off - about 2 weeks total  ?-no antibiotic use recently  ?-LMP: 2 weeks ago  ?-no skin irritation with patch use  ?-no changes in discharge, no smell  ?-wearing patch consistently; switches from arm to arm  ?-no pain, no pain with intercourse ?-Tuesday is patch day  ?-new relationship, just wants to make sure she is good  ? ?Patient Active Problem List  ? Diagnosis Date Noted  ? Encounter for management and injection of depo-Provera 06/02/2020  ? 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 ANKLE PAIN, BILATERAL 03/10/2010  ? EPISTAXIS, RECURRENT 03/10/2010  ? KERATOSIS PILARIS 12/31/2009  ? PITYRIASIS ROSEA 11/04/2009  ? MONILIAL VAGINITIS 04/30/2009  ? 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 CONSTIPATION, RECURRENT 05/16/2008  ? Allergic rhinitis, cause unspecified 05/10/2007  ? Mild intermittent asthma without complication 38/93/7342  ? ECZEMA 05/10/2007  ? ? ?Current Outpatient Medications on File Prior to Visit  ?Medication Sig Dispense Refill  ? Montelukast Sodium (SINGULAIR PO) Take by mouth.    ? norelgestromin-ethinyl estradiol (ORTHO EVRA) 150-35 MCG/24HR transdermal patch Place 1 patch onto the skin once a week. 12 patch 3  ? ?No current facility-administered medications on file prior to visit.  ? ? ?Allergies  ?Allergen Reactions  ? Peanut Allergen Powder-Dnfp Anaphylaxis  ?  Peanut-Containing Drug Products Hives and Shortness Of Breath  ? ? ?Physical Exam:  ?  ?Vitals:  ? 11/07/21 1507  ?BP: 103/60  ?Pulse: 74  ?Weight: 107 lb (48.5 kg)  ?Height: 5' 0.63" (1.54 m)  ? ?Wt Readings from Last 3 Encounters:  ?11/07/21 107 lb (48.5 kg) (11 %, Z= -1.23)*  ?07/05/21 105 lb 6.4 oz (47.8 kg) (9 %, Z= -1.32)*  ?05/10/21 103 lb 9.6 oz (47 kg) (7 %, Z= -1.45)*  ? ?* Growth percentiles are based on CDC (Girls, 2-20 Years) data.  ?  ? ?Blood pressure percentiles are not available for patients who are 18 years or older. ?Patient's last menstrual period was 10/24/2021 (exact date). ? ?Physical Exam ?Constitutional:   ?   General: She is not in acute distress. ?   Appearance: She is well-developed.  ?HENT:  ?   Head: Normocephalic and atraumatic.  ?Eyes:  ?   General: No scleral icterus. ?   Pupils: Pupils are equal, round, and reactive to light.  ?Neck:  ?   Thyroid: No thyromegaly.  ?Cardiovascular:  ?   Rate and Rhythm: Normal rate and regular rhythm.  ?   Heart sounds: Normal heart sounds. No murmur heard. ?Pulmonary:  ?   Effort: Pulmonary effort is normal.  ?   Breath sounds: Normal breath sounds.  ?Abdominal:  ?   Palpations: Abdomen is soft.  ?Musculoskeletal:     ?   General: Normal range of motion.  ?   Cervical back: Normal range of motion  and neck supple.  ?Lymphadenopathy:  ?   Cervical: No cervical adenopathy.  ?Skin: ?   General: Skin is warm and dry.  ?   Findings: No rash.  ?Neurological:  ?   Mental Status: She is alert and oriented to person, place, and time.  ?   Cranial Nerves: No cranial nerve deficit.  ?Psychiatric:     ?   Behavior: Behavior normal.     ?   Thought Content: Thought content normal.     ?   Judgment: Judgment normal.  ?  ? ?Assessment/Plan: ? ?-negative pg test with consistent patch use  ?-reviewed menstrual suppression as needed for upcoming trip to [facility]  ?-will call/reach out by My Chart with results; declines empirical tx for yeast; will await results  ? ?1.  Vaginal discharge ?2. Itching in the vaginal area ?3. Cloudy urine ?- POCT urinalysis dipstick ? ?4. Uses hormonal contraceptive patch as primary birth control method ? ?5. Pregnancy examination or test, negative result ?- POCT urine pregnancy ? ?6. Routine screening for STI (sexually transmitted infection) ?- Urine cytology ancillary only ?- WET PREP BY MOLECULAR PROBE ? ?

## 2021-11-09 ENCOUNTER — Other Ambulatory Visit: Payer: Self-pay | Admitting: Family

## 2021-11-09 LAB — URINE CYTOLOGY ANCILLARY ONLY
Bacterial Vaginitis-Urine: NEGATIVE
Candida Urine: NEGATIVE
Chlamydia: NEGATIVE
Comment: NEGATIVE
Comment: NEGATIVE
Comment: NORMAL
Neisseria Gonorrhea: NEGATIVE
Trichomonas: NEGATIVE

## 2021-11-09 LAB — WET PREP BY MOLECULAR PROBE
Candida species: NOT DETECTED
MICRO NUMBER:: 13160177
SPECIMEN QUALITY:: ADEQUATE
Trichomonas vaginosis: NOT DETECTED

## 2021-11-09 MED ORDER — METRONIDAZOLE 500 MG PO TABS: 500.0000 mg | ORAL_TABLET | Freq: Two times a day (BID) | ORAL | 0 refills | Status: DC

## 2022-01-03 ENCOUNTER — Encounter: Payer: Self-pay | Admitting: Family

## 2022-01-10 ENCOUNTER — Encounter: Payer: Self-pay | Admitting: Family

## 2022-01-10 ENCOUNTER — Ambulatory Visit (INDEPENDENT_AMBULATORY_CARE_PROVIDER_SITE_OTHER): Payer: Medicaid Other | Admitting: Family

## 2022-01-10 VITALS — BP 97/54 | HR 76 | Ht 60.24 in | Wt 105.8 lb

## 2022-01-10 DIAGNOSIS — Z1389 Encounter for screening for other disorder: Secondary | ICD-10-CM

## 2022-01-10 DIAGNOSIS — Z30012 Encounter for prescription of emergency contraception: Secondary | ICD-10-CM

## 2022-01-10 DIAGNOSIS — Z113 Encounter for screening for infections with a predominantly sexual mode of transmission: Secondary | ICD-10-CM

## 2022-01-10 DIAGNOSIS — Z3202 Encounter for pregnancy test, result negative: Secondary | ICD-10-CM

## 2022-01-10 DIAGNOSIS — R634 Abnormal weight loss: Secondary | ICD-10-CM | POA: Diagnosis not present

## 2022-01-10 DIAGNOSIS — N921 Excessive and frequent menstruation with irregular cycle: Secondary | ICD-10-CM

## 2022-01-10 DIAGNOSIS — H5213 Myopia, bilateral: Secondary | ICD-10-CM | POA: Diagnosis not present

## 2022-01-10 DIAGNOSIS — E559 Vitamin D deficiency, unspecified: Secondary | ICD-10-CM | POA: Diagnosis not present

## 2022-01-10 LAB — POCT URINE PREGNANCY: Preg Test, Ur: NEGATIVE

## 2022-01-10 LAB — POCT URINALYSIS DIPSTICK
Bilirubin, UA: NEGATIVE
Blood, UA: NEGATIVE
Glucose, UA: NEGATIVE
Ketones, UA: NEGATIVE
Leukocytes, UA: NEGATIVE
Nitrite, UA: NEGATIVE
Protein, UA: NEGATIVE
Spec Grav, UA: 1.01 (ref 1.010–1.025)
Urobilinogen, UA: NEGATIVE E.U./dL — AB
pH, UA: 7 (ref 5.0–8.0)

## 2022-01-10 MED ORDER — ELLA 30 MG PO TABS: 1.0000 | ORAL_TABLET | Freq: Once | ORAL | 0 refills | Status: AC

## 2022-01-10 NOTE — Patient Instructions (Signed)
It was good to see you today. We will have a video visit next week to review the lab results and go over options to help with the bleeding with your patch use.  Let me know if you have any questions or concerns before then.

## 2022-01-10 NOTE — Progress Notes (Signed)
History was provided by the patient.  [name] is a 21 y.o. female who is here for weight loss, .   PCP confirmed? Yes.    Lurlean Leyden, MD  Plan from last visit:    -negative pg test with consistent patch use  -reviewed menstrual suppression as needed for upcoming trip to [facility]  -will call/reach out by My Chart with results; declines empirical tx for yeast; will await results    1. Vaginal discharge 2. Itching in the vaginal area 3. Cloudy urine - POCT urinalysis dipstick   4. Uses hormonal contraceptive patch as primary birth control method   5. Pregnancy examination or test, negative result - POCT urine pregnancy   6. Routine screening for STI (sexually transmitted infection) - Urine cytology ancillary only - WET PREP BY MOLECULAR PROBE   Pertinent labs: +BV 03/21,  negative preg test, negative gc/c   HPI:   -bleeding with patch still on -sometimes will bleed whole week, sometimes just 2-3 days  -sometimes heavy  -pain with urination x one week, urgency and hesitancy  -having trouble gaining weight  -anxiety yes; tries to eat before customers start coming in; eats once then snacks  -no constipation  -no back pain, no fever, no n/v  -no cramping  -no pain with intercourse  -no vaginal irritation or vaginal discharge changes  -acne has been worse; not sure if because she is working 6 days per week  -grandmother has thyroid issues; questions if she is anemic   Patient Active Problem List   Diagnosis Date Noted   Encounter for management and injection of depo-Provera 06/02/2020   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 (Patient not taking: Reported on 01/10/2022) 14 tablet 0   Montelukast Sodium (SINGULAIR PO) Take by mouth. (Patient not taking: Reported on 01/10/2022)     norelgestromin-ethinyl estradiol Marilu Favre) 150-35 MCG/24HR transdermal patch Place 1 patch onto the skin once a week. (Patient not taking: Reported on 01/10/2022) 12 patch 3   No current facility-administered medications on file prior to visit.    Allergies  Allergen Reactions   Peanut Allergen Powder-Dnfp Anaphylaxis   Peanut-Containing Drug Products Hives and Shortness Of Breath    Physical Exam:    Vitals:   01/10/22 1405  BP: (!) 97/54  Pulse: 76  Weight: 105 lb 12.8 oz (48 kg)  Height: 5' 0.24" (1.53 m)   Wt Readings from Last 3 Encounters:  01/10/22 105 lb 12.8 oz (48 kg) (9 %, Z= -1.33)*  11/07/21 107 lb (48.5 kg) (11 %, Z= -1.23)*  07/05/21 105 lb 6.4 oz (47.8 kg) (9 %, Z= -1.32)*   * Growth percentiles are based on CDC (Girls, 2-20 Years) data.     Blood pressure percentiles are not available for patients who are 18 years or older. No LMP recorded.  Physical Exam Constitutional:      General: She is not in acute distress.    Appearance: She is well-developed.  HENT:     Head: Normocephalic and atraumatic.  Eyes:     General: No scleral icterus.    Pupils: Pupils are equal, round, and reactive to light.  Neck:     Thyroid: No thyromegaly.  Cardiovascular:     Rate and Rhythm: Normal rate and regular rhythm.     Heart sounds: Normal heart sounds. No murmur heard. Pulmonary:     Effort: Pulmonary effort is normal.     Breath sounds: Normal breath sounds.  Abdominal:     Palpations: Abdomen is soft.  Musculoskeletal:        General: Normal range of motion.     Cervical back: Normal range  of motion and neck supple.  Lymphadenopathy:     Cervical: No cervical adenopathy.  Skin:    General: Skin is warm and dry.     Findings: No rash.  Neurological:     Mental Status: She is alert and oriented to person, place, and time.     Cranial Nerves: No cranial nerve deficit.     Motor: No tremor.  Psychiatric:        Mood and Affect: Mood is anxious.        Behavior: Behavior normal.        Thought Content: Thought content normal.        Judgment: Judgment normal.     Assessment/Plan:  -labs for mild weight loss, breakthrough bleeding, and increased anxiety. Will assess amylase, lipase, mag, phos, and CMP; will also assess liver and thyroid function, as well as kidney function d/t symptoms despite UA negative today, specifically no blood or protein in urine. Video visit in one week to review labs and review options for other methods. Reassess anxiety with PHQSADS, consider ASRS.   1. Weight loss 2. Breakthrough bleeding with contraceptive patch  - Amylase - CBC With Differential - Comprehensive metabolic panel - Ferritin - IgA - Lipase - Magnesium - Phosphorus - Sedimentation rate - Tissue transglutaminase, IgA - Thyroid Panel With TSH 3. Vitamin D deficiency - VITAMIN D 25 Hydroxy (Vit-D Deficiency, Fractures)  4. Screening for genitourinary condition - POCT urinalysis dipstick - no sign of infection in UA today   5. Pregnancy examination or test, negative result - POCT urine pregnancy  6. Routine screening for STI (sexually transmitted infection) - C. trachomatis/N. gonorrhoeae RNA - will screen for infection today

## 2022-01-11 LAB — THYROID PANEL WITH TSH
Free Thyroxine Index: 1.9 (ref 1.4–3.8)
T3 Uptake: 28 % (ref 22–35)
T4, Total: 6.9 ug/dL (ref 5.3–11.7)
TSH: 0.56 mIU/L

## 2022-01-11 LAB — TISSUE TRANSGLUTAMINASE, IGA: (tTG) Ab, IgA: <1 U/mL

## 2022-01-11 LAB — CHLAMYDIA PROBE AMP THINPREP: C. trachomatis RNA, TMA: NOT DETECTED

## 2022-01-11 LAB — COMPREHENSIVE METABOLIC PANEL
AG Ratio: 1.4 (calc) (ref 1.0–2.5)
ALT: 55 U/L — ABNORMAL HIGH (ref 5–32)
AST: 26 U/L (ref 12–32)
Albumin: 4.2 g/dL (ref 3.6–5.1)
Alkaline phosphatase (APISO): 52 U/L (ref 36–128)
BUN: 10 mg/dL (ref 7–20)
CO2: 28 mmol/L (ref 20–32)
Calcium: 9 mg/dL (ref 8.9–10.4)
Chloride: 107 mmol/L (ref 98–110)
Creat: 0.7 mg/dL (ref 0.50–0.96)
Globulin: 3.1 g/dL (calc) (ref 2.0–3.8)
Glucose, Bld: 64 mg/dL — ABNORMAL LOW (ref 65–99)
Potassium: 4.1 mmol/L (ref 3.8–5.1)
Sodium: 141 mmol/L (ref 135–146)
Total Bilirubin: 0.3 mg/dL (ref 0.2–1.1)
Total Protein: 7.3 g/dL (ref 6.3–8.2)

## 2022-01-11 LAB — PHOSPHORUS: Phosphorus: 3.2 mg/dL (ref 2.7–5.0)

## 2022-01-11 LAB — IGA: Immunoglobulin A: 247 mg/dL (ref 47–310)

## 2022-01-11 LAB — FERRITIN: Ferritin: 6 ng/mL — ABNORMAL LOW (ref 16–154)

## 2022-01-11 LAB — SEDIMENTATION RATE: Sed Rate: 2 mm/h (ref 0–20)

## 2022-01-11 LAB — AMYLASE: Amylase: 67 U/L (ref 21–101)

## 2022-01-11 LAB — LIPASE: Lipase: 33 U/L (ref 7–60)

## 2022-01-11 LAB — MAGNESIUM: Magnesium: 2 mg/dL (ref 1.5–2.5)

## 2022-01-11 LAB — VITAMIN D 25 HYDROXY (VIT D DEFICIENCY, FRACTURES): Vit D, 25-Hydroxy: 15 ng/mL — ABNORMAL LOW (ref 30–100)

## 2022-01-15 ENCOUNTER — Encounter: Payer: Self-pay | Admitting: Family

## 2022-01-18 ENCOUNTER — Telehealth (INDEPENDENT_AMBULATORY_CARE_PROVIDER_SITE_OTHER): Payer: Medicaid Other | Admitting: Family

## 2022-01-18 ENCOUNTER — Encounter: Payer: Self-pay | Admitting: Family

## 2022-01-18 DIAGNOSIS — E559 Vitamin D deficiency, unspecified: Secondary | ICD-10-CM

## 2022-01-18 DIAGNOSIS — Z789 Other specified health status: Secondary | ICD-10-CM

## 2022-01-18 DIAGNOSIS — R634 Abnormal weight loss: Secondary | ICD-10-CM

## 2022-01-18 DIAGNOSIS — R790 Abnormal level of blood mineral: Secondary | ICD-10-CM | POA: Diagnosis not present

## 2022-01-18 MED ORDER — FERROUS SULFATE 325 (65 FE) MG PO TABS: 325.0000 mg | ORAL_TABLET | Freq: Every day | ORAL | 0 refills | Status: DC

## 2022-01-18 MED ORDER — VITAMIN D (ERGOCALCIFEROL) 1.25 MG (50000 UNIT) PO CAPS: 50000.0000 [IU] | ORAL_CAPSULE | ORAL | 0 refills | Status: DC

## 2022-01-18 MED ORDER — NORELGESTROMIN-ETH ESTRADIOL 150-35 MCG/24HR TD PTWK: 1.0000 | MEDICATED_PATCH | TRANSDERMAL | 3 refills | Status: DC

## 2022-01-18 NOTE — Progress Notes (Signed)
THIS RECORD MAY CONTAIN CONFIDENTIAL INFORMATION THAT SHOULD NOT BE RELEASED WITHOUT REVIEW OF THE SERVICE PROVIDER.  Virtual Follow-Up Visit via Video Note  I connected with [name]   on 01/18/22 at 10:00 AM EDT by a video enabled telemedicine application and verified that I am speaking with the correct person using two identifiers.   Patient/parent location: job in [place]  Provider location: office in [place]    I discussed the limitations of evaluation and management by telemedicine and the availability of in person appointments.  I discussed that the purpose of this telehealth visit is to provide medical care while limiting exposure to the novel coronavirus.  The family expressed understanding and agreed to proceed.   AMAURIE Castaneda is a 21 y.o. female referred by Lurlean Leyden, MD here today for follow-up of weight loss, patch birth control use.   History was provided by the patient.  Supervising Physician: Dr. Lenore Cordia  Plan from Last Visit:     Assessment/Plan:   -labs for mild weight loss, breakthrough bleeding, and increased anxiety. Will assess amylase, lipase, mag, phos, and CMP; will also assess liver and thyroid function, as well as kidney function d/t symptoms despite UA negative today, specifically no blood or protein in urine. Video visit in one week to review labs and review options for other methods. Reassess anxiety with PHQSADS, consider ASRS.    1. Weight loss 2. Breakthrough bleeding with contraceptive patch  - Amylase - CBC With Differential - Comprehensive metabolic panel - Ferritin - IgA - Lipase - Magnesium - Phosphorus - Sedimentation rate - Tissue transglutaminase, IgA - Thyroid Panel With TSH 3. Vitamin D deficiency - VITAMIN D 25 Hydroxy (Vit-D Deficiency, Fractures)   4. Screening for genitourinary condition - POCT urinalysis dipstick - no sign of infection in UA today    5. Pregnancy examination or test,  negative result - POCT urine pregnancy   6. Routine screening for STI (sexually transmitted infection) - C. trachomatis/N. gonorrhoeae RNA - will screen for infection today      Chief Complaint: No breakthrough bleeding with patch   History of Present Illness:  -no bleeding with patch use -wants to continue patch use with continuous cycling  -discussed lab results: vitamin D low (15), ferritin (6) -thyroid labs, and all other labs normal  -no dizziness, no stomach pain, no n/v   Allergies  Allergen Reactions   Peanut Allergen Powder-Dnfp Anaphylaxis   Peanut-Containing Drug Products Hives and Shortness Of Breath   Outpatient Medications Prior to Visit  Medication Sig Dispense Refill   norelgestromin-ethinyl estradiol Marilu Favre) 150-35 MCG/24HR transdermal patch Place 1 patch onto the skin once a week. (Patient not taking: Reported on 01/10/2022) 12 patch 3   No facility-administered medications prior to visit.     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  Visual Observations/Objective:  General Appearance: Well nourished well developed, in no apparent distress.  Eyes: conjunctiva no swelling or erythema ENT/Mouth: No hoarseness, No cough for duration of visit.  Neck: Supple   Respiratory: Respiratory effort normal, normal rate, no retractions or distress.   Cardio: Appears well-perfused, noncyanotic Musculoskeletal: no obvious deformity Skin: visible skin without rashes, ecchymosis, erythema Neuro: Awake and oriented X 3,  Psych:  normal affect, Insight and Judgment appropriate.    Assessment/Plan:  -high dose vitamin d weekly supplement  -iron supplementation 325 mg daily with breakfast  -refill for COC patches  -return to clinic in 2 weeks for weight check  -discussed Ensure supplementation, 1-2 per day   1. Weight loss 2. Uses hormonal contraceptive patch as primary birth control method 3. Low ferritin - ferrous sulfate (FERROUSUL) 325 (65 FE) MG tablet; Take 1 tablet (325 mg total) by mouth daily with breakfast.  Dispense: 90 tablet; Refill: 0  4. Vitamin D deficiency    I discussed the assessment and treatment plan with the patient and/or parent/guardian.  They were provided an opportunity to ask questions and all were answered.  They agreed with the plan and demonstrated an understanding of the instructions. They were advised to call back or seek an in-person evaluation in the emergency room if the symptoms worsen or if the condition fails to improve as anticipated.   Follow-up:  2 weeks in person     Parthenia Ames, NP    CC: Lurlean Leyden, MD, Lurlean Leyden, MD

## 2022-01-27 DIAGNOSIS — H52223 Regular astigmatism, bilateral: Secondary | ICD-10-CM | POA: Diagnosis not present

## 2022-01-27 DIAGNOSIS — H5213 Myopia, bilateral: Secondary | ICD-10-CM | POA: Diagnosis not present

## 2022-03-29 ENCOUNTER — Encounter: Payer: Self-pay | Admitting: Family

## 2022-03-31 ENCOUNTER — Other Ambulatory Visit: Payer: Self-pay

## 2022-03-31 ENCOUNTER — Emergency Department (HOSPITAL_COMMUNITY)
Admission: EM | Admit: 2022-03-31 | Discharge: 2022-03-31 | Disposition: A | Discharge status: Home / Self Care | Payer: Medicaid Other | Attending: Emergency Medicine | Admitting: Emergency Medicine

## 2022-03-31 ENCOUNTER — Ambulatory Visit (HOSPITAL_COMMUNITY): Admission: EM | Admit: 2022-03-31 | Payer: Medicaid Other | Source: Home / Self Care

## 2022-03-31 ENCOUNTER — Encounter (HOSPITAL_COMMUNITY): Payer: Self-pay

## 2022-03-31 ENCOUNTER — Ambulatory Visit: Payer: Medicaid Other | Admitting: Family

## 2022-03-31 DIAGNOSIS — Z91010 Allergy to peanuts: Secondary | ICD-10-CM | POA: Insufficient documentation

## 2022-03-31 DIAGNOSIS — U071 COVID-19: Secondary | ICD-10-CM | POA: Insufficient documentation

## 2022-03-31 DIAGNOSIS — B9689 Other specified bacterial agents as the cause of diseases classified elsewhere: Secondary | ICD-10-CM | POA: Insufficient documentation

## 2022-03-31 DIAGNOSIS — N760 Acute vaginitis: Secondary | ICD-10-CM | POA: Insufficient documentation

## 2022-03-31 LAB — URINALYSIS, ROUTINE W REFLEX MICROSCOPIC
Bacteria, UA: NONE SEEN
Bilirubin Urine: NEGATIVE
Glucose, UA: NEGATIVE mg/dL
Hgb urine dipstick: NEGATIVE
Ketones, ur: 5 mg/dL — AB
Nitrite: NEGATIVE
Protein, ur: NEGATIVE mg/dL
Specific Gravity, Urine: 1.024 (ref 1.005–1.030)
pH: 7 (ref 5.0–8.0)

## 2022-03-31 LAB — WET PREP, GENITAL
Sperm: NONE SEEN
Trich, Wet Prep: NONE SEEN
WBC, Wet Prep HPF POC: >=10 — AB (ref <10)
Yeast Wet Prep HPF POC: NONE SEEN

## 2022-03-31 LAB — RESP PANEL BY RT-PCR (FLU A&B, COVID) ARPGX2
Influenza A by PCR: NEGATIVE
Influenza B by PCR: NEGATIVE
SARS Coronavirus 2 by RT PCR: POSITIVE — AB

## 2022-03-31 LAB — PREGNANCY, URINE: Preg Test, Ur: NEGATIVE

## 2022-03-31 MED ORDER — METRONIDAZOLE 500 MG PO TABS: 500.0000 mg | ORAL_TABLET | Freq: Two times a day (BID) | ORAL | 0 refills | Status: DC

## 2022-03-31 NOTE — ED Triage Notes (Signed)
Patient reports wanting std test bc she gets tested every couple months.  Also reports having a cold and now cant taste and wanting a covid test.

## 2022-03-31 NOTE — ED Notes (Signed)
Discharge instructions reviewed with patient. Patient verbalized understanding of instructions. Follow-up care and medications were reviewed. Patient ambulatory with steady gait. VSS upon discharge.  ?

## 2022-03-31 NOTE — ED Provider Triage Note (Signed)
Emergency Medicine Provider Triage Evaluation Note  [name] , a 21 y.o. female  was evaluated in triage.  Pt complains of cough and cold symptoms as well as vaginal itching. She reports that she has had nasal congestion and a cough for the past few days. Lost her taste last night. She reports some vaginal itching and would like STD testing. Denies any dysuria.   Review of Systems  Positive:  Negative:   Physical Exam  BP 107/65 (BP Location: Right Arm)   Pulse 63   Temp 98.4 F (36.9 C) (Oral)   Resp 18   Ht 5' (1.524 m)   Wt 47.2 kg   SpO2 100%   BMI 20.31 kg/m  Gen:   Awake, no distress   Resp:  Normal effort  MSK:   Moves extremities without difficulty  Other:    Medical Decision Making  Medically screening exam initiated at 10:38 AM.  Appropriate orders placed.  Fauna Neuner Westwood was informed that the remainder of the evaluation will be completed by another provider, this initial triage assessment does not replace that evaluation, and the importance of remaining in the ED until their evaluation is complete.  COVID and wet prep ordered   Sherrell Puller, PA-C 03/31/22 1039

## 2022-03-31 NOTE — Discharge Instructions (Addendum)
  You were seen in the ER for evaluation of your cough and cold symptoms as well as your vaginal symptoms.  You will find out if you are positive for gonorrhea chlamydia in the next few days.  You will receive a phone call.  However, there was signs of bacterial vaginosis on the exam.  For this, and placed you on metronidazole for you to take twice daily for the next 7 days.  Additionally, you tested positive for COVID.  Please take over-the-counter cough and cold medication to help with your symptoms.  Please make sure you are quarantining and isolating yourself for the next 5 days.  I have included you a work note.  I included more patient on quarantine and isolation as well as bacterial vaginosis into this discharge paperwork.  If you have any shortness of breath, chest pain, coughing up any blood, please return to the nearest emergency department for reevaluation.  Contact a doctor if: Your symptoms do not get better, even after you are treated. You have more discharge or pain when you pee. You have a fever or chills. You have pain in your belly (abdomen) or in the area between your hips. You have pain with sex. You bleed from your vagina between menstrual periods.

## 2022-03-31 NOTE — ED Provider Notes (Signed)
  [facility] EMERGENCY DEPARTMENT Provider Note   CSN: [id number] Arrival date & time: 03/31/22  1001     History {Add pertinent medical, surgical, social history, OB history to HPI:1} Chief Complaint  Patient presents with   URI   Exposure to STD    [name] is a 21 y.o. female.   URI Exposure to STD       Home Medications Prior to Admission medications   Medication Sig Start Date End Date Taking? Authorizing Provider  ferrous sulfate (FERROUSUL) 325 (65 FE) MG tablet Take 1 tablet (325 mg total) by mouth daily with breakfast. 01/18/22   Parthenia Ames, NP  norelgestromin-ethinyl estradiol Marilu Favre) 150-35 MCG/24HR transdermal patch Place 1 patch onto the skin once a week. 01/18/22   Parthenia Ames, NP  Vitamin D, Ergocalciferol, (DRISDOL) 1.25 MG (50000 UNIT) CAPS capsule Take 1 capsule (50,000 Units total) by mouth every 7 (seven) days. 01/18/22   Parthenia Ames, NP      Allergies    Peanut allergen powder-dnfp and Peanut-containing drug products    Review of Systems   Review of Systems  Physical Exam Updated Vital Signs BP 107/65 (BP Location: Right Arm)   Pulse 63   Temp 98.4 F (36.9 C) (Oral)   Resp 18   Ht 5' (1.524 m)   Wt 47.2 kg   SpO2 100%   BMI 20.31 kg/m  Physical Exam Vitals and nursing note reviewed. Exam conducted with a chaperone present (Bre, Tech).     ED Results / Procedures / Treatments   Labs (all labs ordered are listed, but only abnormal results are displayed) Labs Reviewed  WET PREP, GENITAL - Abnormal; Notable for the following components:      Result Value   Clue Cells Wet Prep HPF POC PRESENT (*)    WBC, Wet Prep HPF POC >=10 (*)    All other components within normal limits  RESP PANEL BY RT-PCR (FLU A&B, COVID) ARPGX2 - Abnormal; Notable for the following components:   SARS Coronavirus 2 by RT PCR POSITIVE (*)    All other components within normal limits  URINALYSIS, ROUTINE W REFLEX  MICROSCOPIC - Abnormal; Notable for the following components:   APPearance CLOUDY (*)    Ketones, ur 5 (*)    Leukocytes,Ua MODERATE (*)    All other components within normal limits  PREGNANCY, URINE  GC/CHLAMYDIA PROBE AMP () NOT AT [facility]    EKG None  Radiology No results found.  Procedures Procedures  {Document cardiac monitor, telemetry assessment procedure when appropriate:1}  Medications Ordered in ED Medications - No data to display  ED Course/ Medical Decision Making/ A&P                           Medical Decision Making Amount and/or Complexity of Data Reviewed Labs: ordered.  Risk Prescription drug management.   ***  {Document critical care time when appropriate:1} {Document review of labs and clinical decision tools ie heart score, Chads2Vasc2 etc:1}  {Document your independent review of radiology images, and any outside records:1} {Document your discussion with family members, caretakers, and with consultants:1} {Document social determinants of health affecting pt's care:1} {Document your decision making why or why not admission, treatments were needed:1} Final Clinical Impression(s) / ED Diagnoses Final diagnoses:  None    Rx / DC Orders ED Discharge Orders     None

## 2022-04-03 LAB — GC/CHLAMYDIA PROBE AMP (~~LOC~~) NOT AT ARMC
Chlamydia: NEGATIVE
Comment: NEGATIVE
Comment: NORMAL
Neisseria Gonorrhea: NEGATIVE

## 2022-04-04 ENCOUNTER — Emergency Department (HOSPITAL_COMMUNITY)
Admission: EM | Admit: 2022-04-04 | Discharge: 2022-04-04 | Disposition: A | Discharge status: Home / Self Care | Payer: Medicaid Other | Source: Home / Self Care

## 2022-06-01 ENCOUNTER — Encounter: Payer: Self-pay | Admitting: Pediatrics

## 2022-06-11 ENCOUNTER — Other Ambulatory Visit: Payer: Self-pay | Admitting: Family

## 2022-07-25 ENCOUNTER — Other Ambulatory Visit: Payer: Self-pay | Admitting: Family

## 2022-12-12 ENCOUNTER — Encounter: Payer: Self-pay | Admitting: Pediatrics

## 2022-12-12 ENCOUNTER — Ambulatory Visit
Admission: RE | Admit: 2022-12-12 | Discharge: 2022-12-12 | Disposition: A | Discharge status: Home / Self Care | Payer: Medicaid Other | Source: Ambulatory Visit | Attending: Internal Medicine | Admitting: Internal Medicine

## 2022-12-12 VITALS — BP 103/67 | HR 78 | Temp 98.2°F | Resp 16

## 2022-12-12 DIAGNOSIS — B9689 Other specified bacterial agents as the cause of diseases classified elsewhere: Secondary | ICD-10-CM

## 2022-12-12 DIAGNOSIS — B3731 Acute candidiasis of vulva and vagina: Secondary | ICD-10-CM

## 2022-12-12 DIAGNOSIS — N760 Acute vaginitis: Secondary | ICD-10-CM

## 2022-12-12 DIAGNOSIS — R8290 Unspecified abnormal findings in urine: Secondary | ICD-10-CM | POA: Diagnosis not present

## 2022-12-12 LAB — POCT URINALYSIS DIP (MANUAL ENTRY)
Bilirubin, UA: NEGATIVE
Glucose, UA: NEGATIVE mg/dL
Ketones, POC UA: NEGATIVE mg/dL
Nitrite, UA: NEGATIVE
Protein Ur, POC: NEGATIVE mg/dL
Spec Grav, UA: 1.025 (ref 1.010–1.025)
Urobilinogen, UA: 1 E.U./dL
pH, UA: 6 (ref 5.0–8.0)

## 2022-12-12 LAB — POCT URINE PREGNANCY: Preg Test, Ur: NEGATIVE

## 2022-12-12 MED ORDER — FLUCONAZOLE 150 MG PO TABS
150.0000 mg | ORAL_TABLET | ORAL | 0 refills | Status: DC
Start: 2022-12-12 — End: 2023-03-22

## 2022-12-12 MED ORDER — METRONIDAZOLE 500 MG PO TABS: 500.0000 mg | ORAL_TABLET | Freq: Two times a day (BID) | ORAL | 0 refills | Status: DC

## 2022-12-12 NOTE — ED Triage Notes (Signed)
Pt requesting STD and UTI screening-cloudy urine x 1 week-NAD-steady gait

## 2022-12-12 NOTE — Discharge Instructions (Signed)
Please start Flagyl and Diflucan to address a recurrent BV and yeast infection. Make sure you hydrate very well with plain water and a quantity of 64 ounces of water a day.  Please limit drinks that are considered urinary irritants such as soda, sweet tea, coffee, energy drinks, alcohol.  These can worsen your urinary and genital symptoms but also be the source of them.  I will let you know about your vaginal swab and urine culture results through MyChart to see if we need to prescribe or change your antibiotics based off of those results.

## 2022-12-12 NOTE — ED Provider Notes (Signed)
[facility]  Note:  This document was prepared using Conservation officer, historic buildings and may include unintentional dictation errors.  MRN: [id number] DOB: 12-Oct-2000  Subjective:   [name] is a 22 y.o. female presenting for 1 week history of cloudy urine, excess vaginal discharge. Has a history of BV and yeast infections. No fever, n/v, abdominal or pelvic pain, dysuria, urinary frequency and urgency. No recent antibiotics. No known exposures. Has had unprotected sex, 1 female partner. She is trying to hydrate better. Has been trying to eliminate fruit juices, sodas.   No current facility-administered medications for this encounter.  Current Outpatient Medications:    ferrous sulfate (FERROUSUL) 325 (65 FE) MG tablet, Take 1 tablet (325 mg total) by mouth daily with breakfast., Disp: 90 tablet, Rfl: 0   metroNIDAZOLE (FLAGYL) 500 MG tablet, Take 1 tablet (500 mg total) by mouth 2 (two) times daily., Disp: 14 tablet, Rfl: 0   Vitamin D, Ergocalciferol, (DRISDOL) 1.25 MG (50000 UNIT) CAPS capsule, Take 1 capsule (50,000 Units total) by mouth every 7 (seven) days., Disp: 8 capsule, Rfl: 0   ZAFEMY 150-35 MCG/24HR transdermal patch, APPLY 1 PATCH TOPICALLY ONCE A WEEK, Disp: 9 patch, Rfl: 0   Allergies  Allergen Reactions   Peanut Allergen Powder-Dnfp Anaphylaxis   Peanut-Containing Drug Products Hives and Shortness Of Breath    Past Medical History:  Diagnosis Date   Asthma    prn inhaler/neb.   Family history of adverse reaction to anesthesia    pt's mother has hx. of post-op N/V and hx. of being hard to wake up post-op   Fibroadenoma of breast, left    History of febrile seizure      Past Surgical History:  Procedure Laterality Date   MASS EXCISION Left 07/20/2016   Procedure: EXCISION left breast fibroadenoma;  Surgeon: Leonia Corona, MD;  Location: [facility];  Service: General;  Laterality: Left;    Family History   Problem Relation Age of Onset   Hypertension Mother    Asthma Mother    Anesthesia problems Mother        post-op N/V; hard to wake up post-op   Hypertension Maternal Uncle    Kidney disease Maternal Uncle        dialysis   Diabetes Maternal Grandmother    Heart disease Maternal Grandmother    Hypertension Maternal Grandmother    Kidney disease Maternal Grandmother        not yet on dialysis   Congestive Heart Failure Maternal Grandmother    Asthma Maternal Grandmother    Kidney disease Maternal Uncle     Social History   Tobacco Use   Smoking status: Never   Smokeless tobacco: Never  Vaping Use   Vaping Use: Never used  Substance Use Topics   Alcohol use: No   Drug use: No    ROS   Objective:   Vitals: BP 103/67 (BP Location: Right Arm)   Pulse 78   Temp 98.2 F (36.8 C) (Oral)   Resp 16   LMP 12/02/2022   SpO2 97%   Physical Exam Constitutional:      General: She is not in acute distress.    Appearance: Normal appearance. She is well-developed. She is not ill-appearing, toxic-appearing or diaphoretic.  HENT:     Head: Normocephalic and atraumatic.     Nose: Nose normal.     Mouth/Throat:     Mouth: Mucous membranes are moist.  Eyes:  General: No scleral icterus.       Right eye: No discharge.        Left eye: No discharge.     Extraocular Movements: Extraocular movements intact.     Conjunctiva/sclera: Conjunctivae normal.  Cardiovascular:     Rate and Rhythm: Normal rate.  Pulmonary:     Effort: Pulmonary effort is normal.  Abdominal:     General: Bowel sounds are normal. There is no distension.     Palpations: Abdomen is soft. There is no mass.     Tenderness: There is no abdominal tenderness. There is no right CVA tenderness, left CVA tenderness, guarding or rebound.  Skin:    General: Skin is warm and dry.  Neurological:     General: No focal deficit present.     Mental Status: She is alert and oriented to person, place, and time.   Psychiatric:        Mood and Affect: Mood normal.        Behavior: Behavior normal.        Thought Content: Thought content normal.        Judgment: Judgment normal.    Results for orders placed or performed during the hospital encounter of 12/12/22 (from the past 24 hour(s))  POCT urine pregnancy     Status: None   Collection Time: 12/12/22  7:00 PM  Result Value Ref Range   Preg Test, Ur Negative Negative  POCT urinalysis dipstick     Status: Abnormal   Collection Time: 12/12/22  7:00 PM  Result Value Ref Range   Color, UA yellow yellow   Clarity, UA clear clear   Glucose, UA negative negative mg/dL   Bilirubin, UA negative negative   Ketones, POC UA negative negative mg/dL   Spec Grav, UA 7.619 5.093 - 1.025   Blood, UA trace-intact (A) negative   pH, UA 6.0 5.0 - 8.0   Protein Ur, POC negative negative mg/dL   Urobilinogen, UA 1.0 0.2 or 1.0 E.U./dL   Nitrite, UA Negative Negative   Leukocytes, UA Trace (A) Negative    Assessment and Plan :   PDMP not reviewed this encounter.  1. Bacterial vaginosis   2. Yeast vaginitis   3. Cloudy urine    We will treat patient empirically for bacterial vaginosis with Flagyl and for yeast vaginitis with fluconazole.  Labs pending. Counseled patient on potential for adverse effects with medications prescribed/recommended today, ER and return-to-clinic precautions discussed, patient verbalized understanding.    Wallis Bamberg, [place] 12/12/22 1907

## 2022-12-13 LAB — CERVICOVAGINAL ANCILLARY ONLY
Bacterial Vaginitis (gardnerella): POSITIVE — AB
Candida Glabrata: NEGATIVE
Candida Vaginitis: POSITIVE — AB
Chlamydia: NEGATIVE
Comment: NEGATIVE
Comment: NEGATIVE
Comment: NEGATIVE
Comment: NEGATIVE
Comment: NEGATIVE
Comment: NORMAL
Neisseria Gonorrhea: NEGATIVE
Trichomonas: NEGATIVE

## 2022-12-14 ENCOUNTER — Telehealth (HOSPITAL_COMMUNITY): Payer: Self-pay | Admitting: Emergency Medicine

## 2022-12-14 LAB — URINE CULTURE: Culture: 20000 — AB

## 2022-12-14 MED ORDER — NITROFURANTOIN MONOHYD MACRO 100 MG PO CAPS: 100.0000 mg | ORAL_CAPSULE | Freq: Two times a day (BID) | ORAL | 0 refills | Status: DC

## 2022-12-29 ENCOUNTER — Ambulatory Visit
Admission: RE | Admit: 2022-12-29 | Discharge: 2022-12-29 | Disposition: A | Discharge status: Home / Self Care | Payer: Medicaid Other | Source: Ambulatory Visit | Attending: Urgent Care | Admitting: Urgent Care

## 2022-12-29 VITALS — BP 92/58 | HR 83 | Temp 99.7°F | Resp 18

## 2022-12-29 DIAGNOSIS — N898 Other specified noninflammatory disorders of vagina: Secondary | ICD-10-CM | POA: Diagnosis not present

## 2022-12-29 DIAGNOSIS — Z3201 Encounter for pregnancy test, result positive: Secondary | ICD-10-CM | POA: Diagnosis not present

## 2022-12-29 LAB — POCT URINE PREGNANCY: Preg Test, Ur: POSITIVE — AB

## 2022-12-29 NOTE — ED Triage Notes (Signed)
Pt requesting preg test-states she had 2 +upreg home tests-LMP 4/13 -NAD-steady gait

## 2022-12-29 NOTE — Discharge Instructions (Signed)
Start an over-the-counter prenatal vitamin. Avoid smoking of any kind, alcohol use, drug use. Please just use Tylenol at a dose of 650mg  once every 6 hours as needed for your aches, pains, fevers. Do not use any nonsteroidal anti-inflammatories (NSAIDs) like ibuprofen, Motrin, Advil, naproxen, Aleve, etc. which are all available over-the-counter. Establish care with a new obstetrist. We will let you know about your results from the vaginal swab on Monday.

## 2022-12-29 NOTE — ED Provider Notes (Signed)
[facility]  Note:  This document was prepared using Conservation officer, historic buildings and may include unintentional dictation errors.  MRN: [id number] DOB: Apr 27, 2001  Subjective:   [name] is a 22 y.o. female presenting for pregnancy testing. Has had some vaginal discharge. Denies fever, n/v, abdominal pain, pelvic pain, rashes, dysuria, urinary frequency, hematuria.  This would be her first pregnancy test.   No current facility-administered medications for this encounter.  Current Outpatient Medications:    ferrous sulfate (FERROUSUL) 325 (65 FE) MG tablet, Take 1 tablet (325 mg total) by mouth daily with breakfast., Disp: 90 tablet, Rfl: 0   fluconazole (DIFLUCAN) 150 MG tablet, Take 1 tablet (150 mg total) by mouth once a week., Disp: 2 tablet, Rfl: 0   metroNIDAZOLE (FLAGYL) 500 MG tablet, Take 1 tablet (500 mg total) by mouth 2 (two) times daily with a meal. DO NOT CONSUME ALCOHOL WHILE TAKING THIS MEDICATION., Disp: 14 tablet, Rfl: 0   nitrofurantoin, macrocrystal-monohydrate, (MACROBID) 100 MG capsule, Take 1 capsule (100 mg total) by mouth 2 (two) times daily., Disp: 10 capsule, Rfl: 0   Vitamin D, Ergocalciferol, (DRISDOL) 1.25 MG (50000 UNIT) CAPS capsule, Take 1 capsule (50,000 Units total) by mouth every 7 (seven) days., Disp: 8 capsule, Rfl: 0   ZAFEMY 150-35 MCG/24HR transdermal patch, APPLY 1 PATCH TOPICALLY ONCE A WEEK, Disp: 9 patch, Rfl: 0   Allergies  Allergen Reactions   Peanut Allergen Powder-Dnfp Anaphylaxis   Peanut-Containing Drug Products Hives and Shortness Of Breath    Past Medical History:  Diagnosis Date   Asthma    prn inhaler/neb.   Family history of adverse reaction to anesthesia    pt's mother has hx. of post-op N/V and hx. of being hard to wake up post-op   Fibroadenoma of breast, left    History of febrile seizure      Past Surgical History:  Procedure Laterality Date   MASS EXCISION Left 07/20/2016    Procedure: EXCISION left breast fibroadenoma;  Surgeon: Leonia Corona, MD;  Location: [facility];  Service: General;  Laterality: Left;    Family History  Problem Relation Age of Onset   Hypertension Mother    Asthma Mother    Anesthesia problems Mother        post-op N/V; hard to wake up post-op   Hypertension Maternal Uncle    Kidney disease Maternal Uncle        dialysis   Diabetes Maternal Grandmother    Heart disease Maternal Grandmother    Hypertension Maternal Grandmother    Kidney disease Maternal Grandmother        not yet on dialysis   Congestive Heart Failure Maternal Grandmother    Asthma Maternal Grandmother    Kidney disease Maternal Uncle     Social History   Tobacco Use   Smoking status: Never   Smokeless tobacco: Never  Vaping Use   Vaping Use: Never used  Substance Use Topics   Alcohol use: No   Drug use: No    ROS   Objective:   Vitals: BP (!) 92/58 (BP Location: Right Arm)   Pulse 83   Temp 99.7 F (37.6 C) (Oral)   Resp 18   LMP 12/02/2022   SpO2 99%   Physical Exam Constitutional:      General: She is not in acute distress.    Appearance: Normal appearance. She is well-developed. She is not ill-appearing, toxic-appearing or diaphoretic.  HENT:  Head: Normocephalic and atraumatic.     Nose: Nose normal.     Mouth/Throat:     Mouth: Mucous membranes are moist.  Eyes:     General: No scleral icterus.       Right eye: No discharge.        Left eye: No discharge.     Extraocular Movements: Extraocular movements intact.  Cardiovascular:     Rate and Rhythm: Normal rate.  Pulmonary:     Effort: Pulmonary effort is normal.  Skin:    General: Skin is warm and dry.  Neurological:     General: No focal deficit present.     Mental Status: She is alert and oriented to person, place, and time.  Psychiatric:        Mood and Affect: Mood normal.        Behavior: Behavior normal.    Results for orders placed or  performed during the hospital encounter of 12/29/22 (from the past 24 hour(s))  POCT urine pregnancy     Status: Abnormal   Collection Time: 12/29/22  5:14 PM  Result Value Ref Range   Preg Test, Ur Positive (A) Negative   Assessment and Plan :   PDMP not reviewed this encounter.  1. Positive pregnancy test   2. Vaginal discharge     Vaginal swab pending, will treat based off of results. Start pre-natal vitamin. General counseling provided including avoidance of alcohol and cigarettes, caffeine.  Counseled on medications generally safe in pregnancy. Establish with an OB. Counseled patient on potential for adverse effects with medications prescribed/recommended today, ER and return-to-clinic precautions discussed, patient verbalized understanding.    Wallis Bamberg, [place] 12/29/22 1727

## 2023-01-01 LAB — CERVICOVAGINAL ANCILLARY ONLY
Bacterial Vaginitis (gardnerella): NEGATIVE
Candida Glabrata: NEGATIVE
Candida Vaginitis: NEGATIVE
Chlamydia: NEGATIVE
Comment: NEGATIVE
Comment: NEGATIVE
Comment: NEGATIVE
Comment: NEGATIVE
Comment: NEGATIVE
Comment: NORMAL
Neisseria Gonorrhea: NEGATIVE
Trichomonas: NEGATIVE

## 2023-02-13 ENCOUNTER — Telehealth: Payer: Self-pay

## 2023-02-13 NOTE — Telephone Encounter (Signed)
Seen by [facility] 2 weeks ago. They did UPT and thought she would be [redacted] weeks pregnant. Gestational sac seen on ultrasound that day. Seen again today and ultrasound showed same size gest sac, possible fetal pole, no cardiac activity. Needs ultrasound with our office.

## 2023-02-14 ENCOUNTER — Encounter (HOSPITAL_COMMUNITY): Payer: Self-pay | Admitting: Emergency Medicine

## 2023-02-14 ENCOUNTER — Inpatient Hospital Stay (HOSPITAL_COMMUNITY)
Admission: EM | Admit: 2023-02-14 | Discharge: 2023-02-14 | Disposition: A | Discharge status: Home / Self Care | Payer: Medicaid Other | Attending: Obstetrics and Gynecology | Admitting: Obstetrics and Gynecology

## 2023-02-14 ENCOUNTER — Other Ambulatory Visit: Payer: Self-pay

## 2023-02-14 ENCOUNTER — Inpatient Hospital Stay (HOSPITAL_COMMUNITY): Payer: Medicaid Other

## 2023-02-14 DIAGNOSIS — O039 Complete or unspecified spontaneous abortion without complication: Secondary | ICD-10-CM

## 2023-02-14 DIAGNOSIS — O3680X0 Pregnancy with inconclusive fetal viability, not applicable or unspecified: Secondary | ICD-10-CM | POA: Diagnosis not present

## 2023-02-14 DIAGNOSIS — O208 Other hemorrhage in early pregnancy: Secondary | ICD-10-CM | POA: Diagnosis not present

## 2023-02-14 DIAGNOSIS — Z3A10 10 weeks gestation of pregnancy: Secondary | ICD-10-CM

## 2023-02-14 DIAGNOSIS — Z3A01 Less than 8 weeks gestation of pregnancy: Secondary | ICD-10-CM | POA: Diagnosis not present

## 2023-02-14 DIAGNOSIS — O26891 Other specified pregnancy related conditions, first trimester: Secondary | ICD-10-CM | POA: Diagnosis not present

## 2023-02-14 DIAGNOSIS — O0289 Other abnormal products of conception: Secondary | ICD-10-CM | POA: Diagnosis not present

## 2023-02-14 DIAGNOSIS — Z3A00 Weeks of gestation of pregnancy not specified: Secondary | ICD-10-CM | POA: Diagnosis not present

## 2023-02-14 DIAGNOSIS — O26899 Other specified pregnancy related conditions, unspecified trimester: Secondary | ICD-10-CM | POA: Diagnosis present

## 2023-02-14 DIAGNOSIS — R109 Unspecified abdominal pain: Secondary | ICD-10-CM | POA: Diagnosis not present

## 2023-02-14 LAB — URINALYSIS, ROUTINE W REFLEX MICROSCOPIC
Bilirubin Urine: NEGATIVE
Glucose, UA: NEGATIVE mg/dL
Hgb urine dipstick: NEGATIVE
Ketones, ur: NEGATIVE mg/dL
Nitrite: NEGATIVE
Protein, ur: NEGATIVE mg/dL
Specific Gravity, Urine: 1.021 (ref 1.005–1.030)
pH: 5 (ref 5.0–8.0)

## 2023-02-14 LAB — CBC
HCT: 40.7 % (ref 36.0–46.0)
Hemoglobin: 13.5 g/dL (ref 12.0–15.0)
MCH: 27.6 pg (ref 26.0–34.0)
MCHC: 33.2 g/dL (ref 30.0–36.0)
MCV: 83.1 fL (ref 80.0–100.0)
Platelets: 253 10*3/uL (ref 150–400)
RBC: 4.9 MIL/uL (ref 3.87–5.11)
RDW: 13.2 % (ref 11.5–15.5)
WBC: 6.8 10*3/uL (ref 4.0–10.5)
nRBC: 0 % (ref 0.0–0.2)

## 2023-02-14 LAB — COMPREHENSIVE METABOLIC PANEL
ALT: 74 U/L — ABNORMAL HIGH (ref 0–44)
AST: 25 U/L (ref 15–41)
Albumin: 3.9 g/dL (ref 3.5–5.0)
Alkaline Phosphatase: 53 U/L (ref 38–126)
Anion gap: 7 (ref 5–15)
BUN: 6 mg/dL (ref 6–20)
CO2: 24 mmol/L (ref 22–32)
Calcium: 9.2 mg/dL (ref 8.9–10.3)
Chloride: 103 mmol/L (ref 98–111)
Creatinine, Ser: 0.64 mg/dL (ref 0.44–1.00)
GFR, Estimated: >60 mL/min (ref >60)
Glucose, Bld: 84 mg/dL (ref 70–99)
Potassium: 3.8 mmol/L (ref 3.5–5.1)
Sodium: 134 mmol/L — ABNORMAL LOW (ref 135–145)
Total Bilirubin: 0.4 mg/dL (ref 0.3–1.2)
Total Protein: 7.5 g/dL (ref 6.5–8.1)

## 2023-02-14 LAB — ABO/RH: ABO/RH(D): O POS

## 2023-02-14 LAB — HCG, QUANTITATIVE, PREGNANCY: hCG, Beta Chain, Quant, S: 11068 m[IU]/mL — ABNORMAL HIGH (ref <5)

## 2023-02-14 MED ORDER — OXYCODONE HCL 5 MG PO TABS: 5.0000 mg | ORAL_TABLET | ORAL | 0 refills | Status: DC | PRN

## 2023-02-14 MED ORDER — ONDANSETRON 4 MG PO TBDP: 4.0000 mg | ORAL_TABLET | Freq: Four times a day (QID) | ORAL | 0 refills | Status: DC | PRN

## 2023-02-14 MED ORDER — MISOPROSTOL 200 MCG PO TABS: 800.0000 ug | ORAL_TABLET | Freq: Once | ORAL | 0 refills | Status: DC

## 2023-02-14 NOTE — ED Triage Notes (Signed)
Pt c/o abdominal cramping since last night. Pt is pregnant, unknown how far along.

## 2023-02-14 NOTE — MAU Note (Signed)
Patient unable to leave urine sample at this time. 

## 2023-02-14 NOTE — MAU Note (Addendum)
.  [name] is a 22 y.o. at Unknown here in MAU reporting: Sent over from the ED for evaluation of lower abdominal pain in early pregnancy. She reports last night she had one isolated episode of pain across her entire lower abdomen that was sharp. Denies VB, vaginal discharge, and vaginal odors. Denies urinary sx's. Last IC one month ago. Reports first positive at home UPT was 5/9.  Patient reports having two [place] with [facility]. She reports her first [place] was on 6/10 and they only saw a gestational sac. She reports the second [place] was on 6/25 and they saw a yolk sac with "possible" fetal pole with no cardiac activity.  LMP: 12/02/2022 - reports her periods have been regular since switching birth control last year Onset of complaint: Last night Pain score: Denies pain now.  FHT: n/a Lab orders placed from triage: UA

## 2023-02-14 NOTE — Discharge Instructions (Addendum)
We discussed that miscarriage is common with ~1/4 of women experiencing it in their lifetime. We discussed that there is nothing you did or did not do to cause this. We reviewed most common reason is presumed to be genetic abnormalities that allow a pregnancy to start but not continue past an early stage, but realistically we do not know the cause in most cases. We reviewed options of expectant, medical, or surgical management. After counseling you elected for medical management. I have sent a prescription for misoprostol (medicine to open your cervix and cause contractions), zofran (nausea medicine), and 4 tabs of 5mg  Oxycodone (strong opioid pain medicine). We reviewed that cramping and bleeding are normal in the first few hours after taking the medication, but should eventually wane. We discussed return precautions including crescendo abdominal pain, heavy vaginal bleeding soaking >1 pad/hour, and fever.

## 2023-02-14 NOTE — ED Provider Notes (Signed)
HPI:  [name] is a 22 y.o. female presents to the emergency department with complaints of abd cramping.  She is a G1, P0 at approximately 10 weeks.  [facility] is [facility] and had an ultrasound yesterday revealing gestational sac with a possible fetal pole but no cardiac activity requiring repeat ultrasound.  Patient began having abdominal discomfort and cramping last night and decided to come into the emergency department.    ROS:  Positive: Abdominal cramping Negative: Vaginal bleeding, nausea or vomiting  PE:  BP 117/78   Pulse 70   Temp 98 F (36.7 C)   Resp 16   SpO2 100%   General: Awake, no distress  HEENT: Atraumatic  Resp: Normal effort  Cardiac: normal rate Abd: Nondistended, nontender  MSK: moves all extremities without difficulty Neuro: speech clear   MDM: MSE completed and orders placed as needed.    Spoke with the MAU provider will transfer to the MAU   Qiana Landgrebe, Amadeo Garnet, MD 02/14/23 [phone]

## 2023-02-14 NOTE — Telephone Encounter (Signed)
Left message that I am calling in regards to a referral if she could please give the office a call back.   Leonette Nutting  02/14/23

## 2023-02-14 NOTE — MAU Provider Note (Signed)
History     [id number]  Arrival date and time: 02/14/23 1027    Chief Complaint  Patient presents with   Abdominal Cramping   Abdominal Pain     HPI [name] is a 22 y.o. at Unknown by sure LMP, who presents for uncertain pregnancy viability.   Per patient she had [place] about two weeks ago that showed a IUGS [facility] She had a repeat US yesterday that showed the same with possible fetal pole and was told she would be referred for further evaluation Then this morning had some cramping and per return precautions she was given came to MAU She currently denies any abdominal or pelvic pain Has not had vaginal bleeding No burning or pain with urination No vaginal discharge   --/--/O POS (06/26 0829)  OB History     Gravida  1   Para      Term      Preterm      AB      Living         SAB      IAB      Ectopic      Multiple      Live Births              Past Medical History:  Diagnosis Date   Asthma    prn inhaler/neb.   Family history of adverse reaction to anesthesia    pt's mother has hx. of post-op N/V and hx. of being hard to wake up post-op   Fibroadenoma of breast, left    History of febrile seizure     Past Surgical History:  Procedure Laterality Date   MASS EXCISION Left 07/20/2016   Procedure: EXCISION left breast fibroadenoma;  Surgeon: Leonia Corona, MD;  Location: [facility];  Service: General;  Laterality: Left;    Family History  Problem Relation Age of Onset   Hypertension Mother    Asthma Mother    Anesthesia problems Mother        post-op N/V; hard to wake up post-op   Hypertension Maternal Uncle    Kidney disease Maternal Uncle        dialysis   Diabetes Maternal Grandmother    Heart disease Maternal Grandmother    Hypertension Maternal Grandmother    Kidney disease Maternal Grandmother        not yet on dialysis   Congestive Heart Failure Maternal  Grandmother    Asthma Maternal Grandmother    Kidney disease Maternal Uncle     Social History   Socioeconomic History   Marital status: Single    Spouse name: Not on file   Number of children: Not on file   Years of education: Not on file   Highest education level: Not on file  Occupational History   Not on file  Tobacco Use   Smoking status: Never   Smokeless tobacco: Never  Vaping Use   Vaping Use: Never used  Substance and Sexual Activity   Alcohol use: No   Drug use: No   Sexual activity: Yes    Birth control/protection: None  Other Topics Concern   Not on file  Social History Narrative         Social Determinants of Health   Financial Resource Strain: Not on file  Food Insecurity: Not on file  Transportation Needs: Not on file  Physical Activity: Not on file  Stress: Not on file  Social  Connections: Not on file  Intimate Partner Violence: Not on file    Allergies  Allergen Reactions   Peanut Allergen Powder-Dnfp Anaphylaxis   Peanut-Containing Drug Products Hives and Shortness Of Breath    No current facility-administered medications on file prior to encounter.   Current Outpatient Medications on File Prior to Encounter  Medication Sig Dispense Refill   ferrous sulfate (FERROUSUL) 325 (65 FE) MG tablet Take 1 tablet (325 mg total) by mouth daily with breakfast. 90 tablet 0   fluconazole (DIFLUCAN) 150 MG tablet Take 1 tablet (150 mg total) by mouth once a week. 2 tablet 0   metroNIDAZOLE (FLAGYL) 500 MG tablet Take 1 tablet (500 mg total) by mouth 2 (two) times daily with a meal. DO NOT CONSUME ALCOHOL WHILE TAKING THIS MEDICATION. 14 tablet 0   nitrofurantoin, macrocrystal-monohydrate, (MACROBID) 100 MG capsule Take 1 capsule (100 mg total) by mouth 2 (two) times daily. 10 capsule 0   Vitamin D, Ergocalciferol, (DRISDOL) 1.25 MG (50000 UNIT) CAPS capsule Take 1 capsule (50,000 Units total) by mouth every 7 (seven) days. 8 capsule 0   ZAFEMY 150-35  MCG/24HR transdermal patch APPLY 1 PATCH TOPICALLY ONCE A WEEK 9 patch 0     ROS Pertinent positives and negative per HPI, all others reviewed and negative  Physical Exam   BP (!) 111/54 (BP Location: Right Arm)   Pulse 70   Temp 98.5 F (36.9 C) (Oral)   Resp 14   Ht 5' (1.524 m)   Wt 51 kg   LMP 12/02/2022   SpO2 100%   BMI 21.97 kg/m   Patient Vitals for the past 24 hrs:  BP Temp Temp src Pulse Resp SpO2 Height Weight  02/14/23 0739 (!) 111/54 98.5 F (36.9 C) Oral 70 14 100 % 5' (1.524 m) 51 kg  02/14/23 0700 117/78 98 F (36.7 C) -- 70 16 100 % -- --    Physical Exam Vitals reviewed.  Constitutional:      General: She is not in acute distress.    Appearance: She is well-developed. She is not diaphoretic.  Eyes:     General: No scleral icterus. Pulmonary:     Effort: Pulmonary effort is normal. No respiratory distress.  Skin:    General: Skin is warm and dry.  Neurological:     Mental Status: She is alert.     Coordination: Coordination normal.      Cervical Exam    Bedside Ultrasound Not performed.  My interpretation: n/a   Labs Results for orders placed or performed during the hospital encounter of 02/14/23 (from the past 24 hour(s))  Urinalysis, Routine w reflex microscopic -Urine, Clean Catch     Status: Abnormal   Collection Time: 02/14/23  7:46 AM  Result Value Ref Range   Color, Urine YELLOW YELLOW   APPearance HAZY (A) CLEAR   Specific Gravity, Urine 1.021 1.005 - 1.030   pH 5.0 5.0 - 8.0   Glucose, UA NEGATIVE NEGATIVE mg/dL   Hgb urine dipstick NEGATIVE NEGATIVE   Bilirubin Urine NEGATIVE NEGATIVE   Ketones, ur NEGATIVE NEGATIVE mg/dL   Protein, ur NEGATIVE NEGATIVE mg/dL   Nitrite NEGATIVE NEGATIVE   Leukocytes,Ua SMALL (A) NEGATIVE   RBC / HPF 0-5 0 - 5 RBC/hpf   WBC, UA 6-10 0 - 5 WBC/hpf   Bacteria, UA RARE (A) NONE SEEN   Squamous Epithelial / HPF 0-5 0 - 5 /HPF   Mucus PRESENT   CBC  Status: None   Collection Time:  02/14/23  8:29 AM  Result Value Ref Range   WBC 6.8 4.0 - 10.5 K/uL   RBC 4.90 3.87 - 5.11 MIL/uL   Hemoglobin 13.5 12.0 - 15.0 g/dL   HCT 40.9 81.1 - 91.4 %   MCV 83.1 80.0 - 100.0 fL   MCH 27.6 26.0 - 34.0 pg   MCHC 33.2 30.0 - 36.0 g/dL   RDW 78.2 95.6 - 21.3 %   Platelets 253 150 - 400 K/uL   nRBC 0.0 0.0 - 0.2 %  Comprehensive metabolic panel     Status: Abnormal   Collection Time: 02/14/23  8:29 AM  Result Value Ref Range   Sodium 134 (L) 135 - 145 mmol/L   Potassium 3.8 3.5 - 5.1 mmol/L   Chloride 103 98 - 111 mmol/L   CO2 24 22 - 32 mmol/L   Glucose, Bld 84 70 - 99 mg/dL   BUN 6 6 - 20 mg/dL   Creatinine, Ser 0.86 0.44 - 1.00 mg/dL   Calcium 9.2 8.9 - 57.8 mg/dL   Total Protein 7.5 6.5 - 8.1 g/dL   Albumin 3.9 3.5 - 5.0 g/dL   AST 25 15 - 41 U/L   ALT 74 (H) 0 - 44 U/L   Alkaline Phosphatase 53 38 - 126 U/L   Total Bilirubin 0.4 0.3 - 1.2 mg/dL   GFR, Estimated >46 >96 mL/min   Anion gap 7 5 - 15  hCG, quantitative, pregnancy     Status: Abnormal   Collection Time: 02/14/23  8:29 AM  Result Value Ref Range   hCG, Beta Chain, Quant, S 11,068 (H) <5 mIU/mL  ABO/Rh     Status: None   Collection Time: 02/14/23  8:29 AM  Result Value Ref Range   ABO/RH(D) O POS    No rh immune globuloin      NOT A RH IMMUNE GLOBULIN CANDIDATE, PT RH POSITIVE Performed at [facility], [address] [place], [place] [address]     Imaging US OB LESS THAN 14 WEEKS WITH [place] TRANSVAGINAL  Result Date: 02/14/2023 CLINICAL DATA:  Pregnancy of unknown location. Abdominal pain last night. Quantitative beta HCG is pending. EXAM: OBSTETRIC <14 WK [place] AND TRANSVAGINAL OB US TECHNIQUE: Both transabdominal and transvaginal ultrasound examinations were performed for complete evaluation of the gestation as well as the maternal uterus, adnexal regions, and pelvic cul-de-sac. Transvaginal technique was performed to assess early pregnancy. COMPARISON:  None Available. FINDINGS: Intrauterine  gestational sac: Single Yolk sac:  Not Visualized. Embryo:  Visualized. Cardiac Activity: Not Visualized. Heart Rate: Absent CRL: 3.8 mm   6 w   1 d                  [place] EDC: 10/09/2023 Subchorionic hemorrhage:  Small subchorionic hemorrhage noted. Maternal uterus/adnexae: Ovaries are unremarkable. Trace free pelvic fluid is likely physiologic. IMPRESSION: Intrauterine gestational sac an intrauterine fetal pole measuring 3.8 millimeters. No cardiac activity identified at this time.  Yolk sac not seen. Recommend follow-up ultrasound in 1-2 weeks to assess for appropriate progression and presence of cardiac activity. Small subchorionic hemorrhage. Electronically Signed   By: Norva Pavlov M.D.   On: 02/14/2023 09:00    MAU Course  Procedures Lab Orders         Urinalysis, Routine w reflex microscopic -Urine, Clean Catch         CBC         Comprehensive metabolic panel  hCG, quantitative, pregnancy    Meds ordered this encounter  Medications   misoprostol (CYTOTEC) 200 MCG tablet    Sig: Take 4 tablets (800 mcg total) by mouth once for 1 dose.    Dispense:  4 tablet    Refill:  0   ondansetron (ZOFRAN-ODT) 4 MG disintegrating tablet    Sig: Take 1 tablet (4 mg total) by mouth every 6 (six) hours as needed for nausea.    Dispense:  20 tablet    Refill:  0   oxyCODONE (OXY IR/ROXICODONE) 5 MG immediate release tablet    Sig: Take 1 tablet (5 mg total) by mouth every 4 (four) hours as needed for severe pain or breakthrough pain.    Dispense:  4 tablet    Refill:  0   Imaging Orders         US OB LESS THAN 14 WEEKS WITH OB TRANSVAGINAL     MDM Moderate (Level 3-4)  Assessment and Plan  #Nonviable pregnancy Spoke with Dr. Vergie Living who was able to access [facility] and confirm there was an IUGS on 01/29/2023. With [place] result today she meets definitive criteria for nonviable pregnancy. Reassured patient that miscarriage is common with ~1/4 of women experiencing it in  their lifetime. Reassured patient that there is nothing she did or did not do to cause this. Reviewed most common reason is presumed to be genetic abnormalities that allow a pregnancy to start but not continue past an early stage, but realistically we do not know the cause in most cases.  Reviewed options of expectant, medical, or surgical management. After counseling she elected for medical management. Rx sent for misoprostol 800 mcg buccal, zofran ODT, and 4 tabs of 5mg  Oxycodone. Reviewed that cramping, bleeding are normal in the first few hours after taking the medication, but should eventually wane. Reviewed warning signs of heavy vaginal bleeding soaking through >1 pad per hour, crescendo abdominal pain, and fever. . Blood type --/--/O POS (06/26 0829), rhogam was not indicated. We discussed return precautions including crescendo abdominal pain, heavy vaginal bleeding soaking >1 pad/hour, and fever.  Message sent to schedule SAB follow up at [facility] in 2-3 weeks.    Dispo: discharged to home in stable condition    Venora Maples, MD/MPH 02/14/23 10:35 AM  Allergies as of 02/14/2023       Reactions   Peanut Allergen Powder-dnfp Anaphylaxis   Peanut-containing Drug Products Hives, Shortness Of Breath        Medication List     TAKE these medications    ferrous sulfate 325 (65 FE) MG tablet Commonly known as: FerrouSul Take 1 tablet (325 mg total) by mouth daily with breakfast.   fluconazole 150 MG tablet Commonly known as: DIFLUCAN Take 1 tablet (150 mg total) by mouth once a week.   metroNIDAZOLE 500 MG tablet Commonly known as: Flagyl Take 1 tablet (500 mg total) by mouth 2 (two) times daily with a meal. DO NOT CONSUME ALCOHOL WHILE TAKING THIS MEDICATION.   misoprostol 200 MCG tablet Commonly known as: CYTOTEC Take 4 tablets (800 mcg total) by mouth once for 1 dose.   nitrofurantoin (macrocrystal-monohydrate) 100 MG capsule Commonly known as: MACROBID Take 1 capsule  (100 mg total) by mouth 2 (two) times daily.   ondansetron 4 MG disintegrating tablet Commonly known as: ZOFRAN-ODT Take 1 tablet (4 mg total) by mouth every 6 (six) hours as needed for nausea.   oxyCODONE 5 MG immediate release tablet Commonly  known as: Oxy IR/ROXICODONE Take 1 tablet (5 mg total) by mouth every 4 (four) hours as needed for severe pain or breakthrough pain.   Vitamin D (Ergocalciferol) 1.25 MG (50000 UNIT) Caps capsule Commonly known as: DRISDOL Take 1 capsule (50,000 Units total) by mouth every 7 (seven) days.   Zafemy 150-35 MCG/24HR transdermal patch Generic drug: norelgestromin-ethinyl estradiol APPLY 1 PATCH TOPICALLY ONCE A WEEK

## 2023-02-15 ENCOUNTER — Other Ambulatory Visit: Payer: Self-pay

## 2023-02-15 DIAGNOSIS — O039 Complete or unspecified spontaneous abortion without complication: Secondary | ICD-10-CM

## 2023-02-15 DIAGNOSIS — O0289 Other abnormal products of conception: Secondary | ICD-10-CM

## 2023-02-15 NOTE — Telephone Encounter (Signed)
Called pt; VM left with callback number if patient desires to be seen. [facility] notified that we have not been able to contact patient.

## 2023-02-23 ENCOUNTER — Ambulatory Visit (HOSPITAL_BASED_OUTPATIENT_CLINIC_OR_DEPARTMENT_OTHER)
Admission: RE | Admit: 2023-02-23 | Discharge: 2023-02-23 | Disposition: A | Discharge status: Home / Self Care | Payer: Medicaid Other | Source: Ambulatory Visit | Attending: Family Medicine | Admitting: Family Medicine

## 2023-02-23 ENCOUNTER — Ambulatory Visit (HOSPITAL_BASED_OUTPATIENT_CLINIC_OR_DEPARTMENT_OTHER): Payer: Medicaid Other

## 2023-02-23 DIAGNOSIS — O0289 Other abnormal products of conception: Secondary | ICD-10-CM | POA: Insufficient documentation

## 2023-02-23 DIAGNOSIS — O039 Complete or unspecified spontaneous abortion without complication: Secondary | ICD-10-CM | POA: Insufficient documentation

## 2023-02-27 ENCOUNTER — Telehealth: Payer: Self-pay

## 2023-02-27 NOTE — Telephone Encounter (Signed)
-----   Message from Venora Maples, MD sent at 02/27/2023  8:39 AM EDT ----- Repeat US at patient's request again shows non viable pregnancy. Has already been prescribed outpatient misoprostol at MAU visit on 02/14/2023. Please call and check in with patient to see if she plans to take misoprostol or if she would like a D&C.

## 2023-02-27 NOTE — Telephone Encounter (Signed)
Left message for patient to return call to the office. Justyne Roell RN 

## 2023-02-27 NOTE — Progress Notes (Signed)
Repeat US at patient's request again shows non viable pregnancy. Has already been prescribed outpatient misoprostol at MAU visit on 02/14/2023. Please call and check in with patient to see if she plans to take misoprostol or if she would like a D&C.

## 2023-03-22 ENCOUNTER — Ambulatory Visit
Admission: RE | Admit: 2023-03-22 | Discharge: 2023-03-22 | Disposition: A | Discharge status: Home / Self Care | Payer: Medicaid Other | Source: Ambulatory Visit | Attending: Emergency Medicine | Admitting: Emergency Medicine

## 2023-03-22 ENCOUNTER — Telehealth: Payer: Self-pay | Admitting: Emergency Medicine

## 2023-03-22 VITALS — BP 120/81 | HR 62 | Temp 98.7°F | Resp 16

## 2023-03-22 DIAGNOSIS — N3001 Acute cystitis with hematuria: Secondary | ICD-10-CM

## 2023-03-22 DIAGNOSIS — Z3201 Encounter for pregnancy test, result positive: Secondary | ICD-10-CM

## 2023-03-22 LAB — POCT URINALYSIS DIP (MANUAL ENTRY)
Bilirubin, UA: NEGATIVE
Glucose, UA: NEGATIVE mg/dL
Nitrite, UA: POSITIVE — AB
Protein Ur, POC: NEGATIVE mg/dL
Spec Grav, UA: 1.02 (ref 1.010–1.025)
Urobilinogen, UA: 1 E.U./dL
pH, UA: 6 (ref 5.0–8.0)

## 2023-03-22 LAB — POCT URINE PREGNANCY: Preg Test, Ur: POSITIVE — AB

## 2023-03-22 MED ORDER — AMOXICILLIN-POT CLAVULANATE 875-125 MG PO TABS: 1.0000 | ORAL_TABLET | Freq: Two times a day (BID) | ORAL | 0 refills | Status: AC

## 2023-03-22 NOTE — Telephone Encounter (Signed)
Patient reports a miscarriage at the end of June.  Patient had significant amount of bleeding so she went to the emergency room at [facility] on July 5 where transvaginal ultrasound was performed.  The ultrasound revealed a gestational sac without fetal pole.  There are several messages in patient's chart where staff is tried to contact her by phone to discuss whether or not she would like to pursue having a D&C or taking the mifepristone to completely eradicate the pregnancy.  Patient was contacted again by phone 03/22/2023 by me.  Patient states she did not pursue D&C or mifepristone after being seen on July 5.  Patient states she did not receive this message is from the clinical providers.  We have checked a urine hCG level today which is still positive.  We have checked a serum hCG level.  After speaking with patient, we have agreed to await those results which should be available in the next few days and decide how to move proceed based on those results.

## 2023-03-22 NOTE — ED Triage Notes (Signed)
Pt reports vaginal itching, urine with strong smell x 1 month. Reports she had a miscarriage on 02/14/23

## 2023-03-22 NOTE — ED Provider Notes (Signed)
[facility]    CSN: [id number] Arrival date & time: 03/22/23  1719    HISTORY   Chief Complaint  Patient presents with   Vaginal Itching    it's not bad itching . as well as my pee smell is strong . - Entered by patient   HPI [name] is a pleasant, 22 y.o. female who presents to urgent care today. Patient complains of vaginal itching and urine malodor for the past month.  Patient reports having had a miscarriage on 11/14/2022. Patient denies burning with urination, increased frequency of urination, suprapubic pain, perineal pain, flank pain, fever, chills, malaise, rigors, significant fatigue, abnormal vaginal discharge, and known exposure to STD.  EMR reviewed by me.  Patient has a history of both BV and vaginal Candida in the past, last episode in April 2024.  Urine pregnancy test today was positive.  Patient states she has been engaging in sexual intercourse, currently is using birth control patch and has not been allowing her partner to ejaculate inside her vagina.  Patient states she has not had a period since her miscarriage.  The history is provided by the patient.   Past Medical History:  Diagnosis Date   Asthma    prn inhaler/neb.   Family history of adverse reaction to anesthesia    pt's mother has hx. of post-op N/V and hx. 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   CONSTIPATION, RECURRENT 05/16/2008   Allergic rhinitis, cause unspecified 05/10/2007   Mild intermittent asthma without complication 05/10/2007   ECZEMA 05/10/2007   Past  Surgical History:  Procedure Laterality Date   MASS EXCISION Left 07/20/2016   Procedure: EXCISION left breast fibroadenoma;  Surgeon: Leonia Corona, MD;  Location: [facility];  Service: General;  Laterality: Left;   OB History     Gravida  2   Para      Term      Preterm      AB  1   Living         SAB  1   IAB      Ectopic      Multiple      Live Births             Home Medications    Prior to Admission medications   Medication Sig Start Date End Date Taking? Authorizing Provider  ferrous sulfate (FERROUSUL) 325 (65 FE) MG tablet Take 1 tablet (325 mg total) by mouth daily with breakfast. 01/18/22   Georges Mouse, NP  fluconazole (DIFLUCAN) 150 MG tablet Take 1 tablet (150 mg total) by mouth once a week. 12/12/22   Wallis Bamberg, PA-C  metroNIDAZOLE (FLAGYL) 500 MG tablet Take 1 tablet (500 mg total) by mouth 2 (two) times daily with a meal. DO NOT CONSUME ALCOHOL WHILE TAKING THIS MEDICATION. 12/12/22   Wallis Bamberg, PA-C  misoprostol (CYTOTEC) 200 MCG tablet Take 4 tablets (800 mcg total) by mouth once for 1 dose. 02/14/23 02/14/23  Venora Maples, MD  nitrofurantoin, macrocrystal-monohydrate, (MACROBID) 100 MG capsule  Take 1 capsule (100 mg total) by mouth 2 (two) times daily. 12/14/22   Lamptey, Britta Mccreedy, MD  ondansetron (ZOFRAN-ODT) 4 MG disintegrating tablet Take 1 tablet (4 mg total) by mouth every 6 (six) hours as needed for nausea. 02/14/23   Venora Maples, MD  oxyCODONE (OXY IR/ROXICODONE) 5 MG immediate release tablet Take 1 tablet (5 mg total) by mouth every 4 (four) hours as needed for severe pain or breakthrough pain. 02/14/23   Venora Maples, MD  Vitamin D, Ergocalciferol, (DRISDOL) 1.25 MG (50000 UNIT) CAPS capsule Take 1 capsule (50,000 Units total) by mouth every 7 (seven) days. 01/18/22   Georges Mouse, NP  ZAFEMY 150-35 MCG/24HR transdermal patch APPLY 1 PATCH TOPICALLY ONCE A WEEK 07/25/22   Georges Mouse, NP     Family History Family History  Problem Relation Age of Onset   Hypertension Mother    Asthma Mother    Anesthesia problems Mother        post-op N/V; hard to wake up post-op   Hypertension Maternal Uncle    Kidney disease Maternal Uncle        dialysis   Diabetes Maternal Grandmother    Heart disease Maternal Grandmother    Hypertension Maternal Grandmother    Kidney disease Maternal Grandmother        not yet on dialysis   Congestive Heart Failure Maternal Grandmother    Asthma Maternal Grandmother    Kidney disease Maternal Uncle    Social History Social History   Tobacco Use   Smoking status: Never   Smokeless tobacco: Never  Vaping Use   Vaping status: Never Used  Substance Use Topics   Alcohol use: No   Drug use: No   Allergies   Peanut allergen powder-dnfp and Peanut-containing drug products  Review of Systems Review of Systems Pertinent findings revealed after performing a 14 point review of systems has been noted in the history of present illness.  Physical Exam Vital Signs BP 120/81 (BP Location: Left Arm)   Pulse 62   Temp 98.7 F (37.1 C) (Oral)   Resp 16   LMP 12/02/2022 Comment: Pt had a miscarriage on 02/14/23  SpO2 99%   Breastfeeding No   No data found.  Physical Exam Vitals and nursing note reviewed.  Constitutional:      General: She is not in acute distress.    Appearance: Normal appearance. She is not ill-appearing.  HENT:     Head: Normocephalic and atraumatic.  Eyes:     General: Lids are normal.        Right eye: No discharge.        Left eye: No discharge.     Extraocular Movements: Extraocular movements intact.     Conjunctiva/sclera: Conjunctivae normal.     Right eye: Right conjunctiva is not injected.     Left eye: Left conjunctiva is not injected.  Neck:     Trachea: Trachea and phonation normal.  Cardiovascular:     Rate and Rhythm: Normal rate and regular rhythm.     Pulses: Normal pulses.     Heart sounds:  Normal heart sounds. No murmur heard.    No friction rub. No gallop.  Pulmonary:     Effort: Pulmonary effort is normal. No accessory muscle usage, prolonged expiration or respiratory distress.     Breath sounds: Normal breath sounds. No stridor, decreased air movement or transmitted upper airway sounds. No decreased breath sounds, wheezing, rhonchi or rales.  Chest:     Chest wall: No tenderness.  Abdominal:     General: Abdomen is flat. Bowel sounds are normal. There is no distension.     Palpations: Abdomen is soft.     Tenderness: There is no abdominal tenderness.  Genitourinary:    Comments: Patient politely declines pelvic exam today, patient provided a vaginal swab for testing. Musculoskeletal:        General: Normal range of motion.     Cervical back: Normal range of motion and neck supple. Normal range of motion.  Lymphadenopathy:     Cervical: No cervical adenopathy.  Skin:    General: Skin is warm and dry.     Findings: No erythema or rash.  Neurological:     General: No focal deficit present.     Mental Status: She is alert and oriented to person, place, and time.  Psychiatric:        Mood and Affect: Mood normal.        Behavior: Behavior normal.     Visual Acuity Right Eye Distance:   Left Eye Distance:   Bilateral Distance:    Right Eye Near:   Left Eye Near:    Bilateral Near:     UC Couse / Diagnostics / Procedures:     Radiology No results found.  Procedures Procedures (including critical care time) EKG  Pending results:  Labs Reviewed  POCT URINALYSIS DIP (MANUAL ENTRY) - Abnormal; Notable for the following components:      Result Value   Ketones, POC UA moderate (40) (*)    Blood, UA small (*)    Nitrite, UA Positive (*)    Leukocytes, UA Small (1+) (*)    All other components within normal limits  POCT URINE PREGNANCY - Abnormal; Notable for the following components:   Preg Test, Ur Positive (*)    All other components within normal  limits  URINE CULTURE  HCG, SERUM, QUALITATIVE  CERVICOVAGINAL ANCILLARY ONLY    Medications Ordered in UC: Medications - No data to display  UC Diagnoses / Final Clinical Impressions(s)   I have reviewed the triage vital signs and the nursing notes.  Pertinent labs & imaging results that were available during my care of the patient were reviewed by me and considered in my medical decision making (see chart for details).    Final diagnoses:  Positive urine pregnancy test  Acute cystitis with hematuria   Urine dip today revealed nitrites.  Urine culture will be performed per our protocol.   Patient was advised to begin antibiotics now due to findings on urine dip. Patient was advised to begin antibiotics today due to having active symptoms of urinary tract infection.                    Patient was advised to take all doses exactly as prescribed.  Patient also advised of risks of worsening infection with incomplete antibiotic therapy. Patient advised that they will be contacted with results and that adjustments to treatment will be provided as indicated based on the results.   Patient was advised of possibility that urine culture results may be negative if sample provided was obtained late in the day causing urine to be more diluted.  Patient was advised that if antibiotics were effective after the first 24 to 36 hours, despite negative urine culture result, it is recommended that they complete the full course as prescribed.   Augmentin was selected for treatment due to patient  having a positive urine pregnancy test today.  STD screening was performed, patient advised that the results be posted to their MyChart and if any of the results are positive, they will be notified by phone, further treatment will be provided as indicated based on results of STD screening. Patient was advised to abstain from sexual intercourse until that they receive the results of their STD testing.  Patient was also  advised to use condoms to protect themselves from STD exposure. Urine pregnancy test was positive.  Because patient had a miscarriage a little over a month ago, we will perform a serum hCG level and advise her of the results once received.  Because the transvaginal ultrasound performed at her last Return precautions advised.  Drug allergies reviewed, all questions addressed.   Upon further review of EMR, patient had a transvaginal ultrasound at the [facility] emergency room on July 5.  4 days later, there are 2 notes in her chart regarding whether or not patient would like to take mifepristone or pursue a D&C for removal of the nonviable fetus from her wound.  I personally contacted patient by phone after she left our clinic to obtain further details.  Patient states she went to the [facility] location because she was having heavy bleeding and was advised that she was no longer pregnant.  Patient states she also did not receive any messages regarding the mifepristone or the D&C.  Patient and I discussed awaiting the results of her hCG to determine how best to proceed.  I also advised patient to reach out to her gynecologist to schedule appointment for early next week so that she can have 1 already scheduled in case her hCG does remain elevated and another ultrasound may be warranted.  Please see discharge instructions below for details of plan of care as provided to patient. ED Prescriptions     Medication Sig Dispense Auth. Provider   amoxicillin-clavulanate (AUGMENTIN) 875-125 MG tablet Take 1 tablet by mouth every 12 (twelve) hours for 7 days. 14 tablet Theadora Rama Scales, PA-C      PDMP not reviewed this encounter.  Disposition Upon Discharge:  Condition: stable for discharge home  Patient presented with concern for an acute illness with associated systemic symptoms and significant discomfort requiring urgent management. In my opinion, this is a condition that a prudent lay person (someone  who possesses an average knowledge of health and medicine) may potentially expect to result in complications if not addressed urgently such as respiratory distress, impairment of bodily function or dysfunction of bodily organs.   As such, the patient has been evaluated and assessed, work-up was performed and treatment was provided in alignment with urgent care protocols and evidence based medicine.  Patient/parent/caregiver has been advised that the patient may require follow up for further testing and/or treatment if the symptoms continue in spite of treatment, as clinically indicated and appropriate.  Routine symptom specific, illness specific and/or disease specific instructions were discussed with the patient and/or caregiver at length.  Prevention strategies for avoiding STD exposure were also discussed.  The patient will follow up with their current PCP if and as advised. If the patient does not currently have a PCP we will assist them in obtaining one.   The patient may need specialty follow up if the symptoms continue, in spite of conservative treatment and management, for further workup, evaluation, consultation and treatment as clinically indicated and appropriate.  Patient/parent/caregiver verbalized understanding and agreement of plan as discussed.  All questions were  addressed during visit.  Please see discharge instructions below for further details of plan.  Discharge Instructions:   Discharge Instructions      Common causes of urinary tract infections include but are not limited to holding your urine longer than you should, squatting instead of sitting down when urinating, sitting around in wet clothing such as a wet swimsuit or gym clothes too long, not emptying your bladder after having sexual intercourse, wiping from back to front instead of front to back after having a bowel movement.     The urinalysis that we performed in the clinic today was abnormal.  Urine culture will be  performed per our protocol.  The result of the urine culture will be available in the next 3 to 5 days and will be posted to your MyChart account.  If there is an abnormal finding, you will be contacted by phone and advised of further treatment recommendations, if any.   You were advised to begin antibiotics today because your urinalysis is abnormal and you are having active symptoms of an acute lower urinary tract infection also known as cystitis.  It is very important that you take all doses exactly as prescribed.  Incomplete antibiotic therapy can cause worsening urinary tract infection that can become aggressive, escape from urinary tract into your bloodstream causing sepsis which will require hospitalization.   Please pick up and begin taking your prescription for Augmentin (amoxicillin-clavulanic acid) as soon as possible.  Please take all doses exactly as prescribed.  You can take this medication with or without food.  This medication is safe to take with your other medications.  The results of your vaginal swab test which screens for BV, yeast, gonorrhea, chlamydia and trichomonas will be made posted to your MyChart account once it is complete.  This typically takes 2 to 4 days.  Please abstain from sexual intercourse of any kind, vaginal, oral or anal, until you have received the results of your STD testing.     If any of your results are abnormal, you will receive a phone call regarding treatment.  Prescriptions, if any are needed, will be provided for you at your pharmacy.      If you have not had complete resolution of your symptoms after completing treatment as prescribed, please return to urgent care for repeat evaluation or follow-up with your primary care provider.  Repeat urinalysis and urine culture may be indicated for more directed therapy.   Thank you for visiting urgent care today.  I appreciate the opportunity to participate in your care.       This office note has been  dictated using Teaching laboratory technician.  Unfortunately, this method of dictation can sometimes lead to typographical or grammatical errors.  I apologize for your inconvenience in advance if this occurs.  Please do not hesitate to reach out to me if clarification is needed.       Theadora Rama Scales, [place] 03/22/23 [phone]

## 2023-03-22 NOTE — Discharge Instructions (Addendum)
Common causes of urinary tract infections include but are not limited to holding your urine longer than you should, squatting instead of sitting down when urinating, sitting around in wet clothing such as a wet swimsuit or gym clothes too long, not emptying your bladder after having sexual intercourse, wiping from back to front instead of front to back after having a bowel movement.     The urinalysis that we performed in the clinic today was abnormal.  Urine culture will be performed per our protocol.  The result of the urine culture will be available in the next 3 to 5 days and will be posted to your MyChart account.  If there is an abnormal finding, you will be contacted by phone and advised of further treatment recommendations, if any.   You were advised to begin antibiotics today because your urinalysis is abnormal and you are having active symptoms of an acute lower urinary tract infection also known as cystitis.  It is very important that you take all doses exactly as prescribed.  Incomplete antibiotic therapy can cause worsening urinary tract infection that can become aggressive, escape from urinary tract into your bloodstream causing sepsis which will require hospitalization.   Please pick up and begin taking your prescription for Augmentin (amoxicillin-clavulanic acid) as soon as possible.  Please take all doses exactly as prescribed.  You can take this medication with or without food.  This medication is safe to take with your other medications.  The results of your vaginal swab test which screens for BV, yeast, gonorrhea, chlamydia and trichomonas will be made posted to your MyChart account once it is complete.  This typically takes 2 to 4 days.  Please abstain from sexual intercourse of any kind, vaginal, oral or anal, until you have received the results of your STD testing.     If any of your results are abnormal, you will receive a phone call regarding treatment.  Prescriptions, if any are  needed, will be provided for you at your pharmacy.      If you have not had complete resolution of your symptoms after completing treatment as prescribed, please return to urgent care for repeat evaluation or follow-up with your primary care provider.  Repeat urinalysis and urine culture may be indicated for more directed therapy.   Thank you for visiting urgent care today.  I appreciate the opportunity to participate in your care.

## 2023-03-23 ENCOUNTER — Telehealth (HOSPITAL_COMMUNITY): Payer: Self-pay | Admitting: Emergency Medicine

## 2023-03-23 NOTE — Telephone Encounter (Signed)
Unfortunately, Serum HCG qualitative was performed instead of quantitative.  [facility] contacted to perform quantitative now. Will notify pt of results once received.

## 2023-03-23 NOTE — Telephone Encounter (Signed)
Per Lillia Abed, APP she wanted Quantitative HCG, called [facility] and added it, they will check for sufficient blood.

## 2023-04-03 ENCOUNTER — Other Ambulatory Visit: Payer: Self-pay

## 2023-04-03 ENCOUNTER — Encounter (HOSPITAL_COMMUNITY): Payer: Self-pay

## 2023-04-03 ENCOUNTER — Emergency Department (HOSPITAL_COMMUNITY)
Admission: EM | Admit: 2023-04-03 | Discharge: 2023-04-04 | Disposition: A | Discharge status: Home / Self Care | Payer: Medicaid Other | Attending: Emergency Medicine | Admitting: Emergency Medicine

## 2023-04-03 DIAGNOSIS — T7840XA Allergy, unspecified, initial encounter: Secondary | ICD-10-CM | POA: Diagnosis not present

## 2023-04-03 DIAGNOSIS — Z91010 Allergy to peanuts: Secondary | ICD-10-CM | POA: Insufficient documentation

## 2023-04-03 DIAGNOSIS — R000 Tachycardia, unspecified: Secondary | ICD-10-CM | POA: Diagnosis not present

## 2023-04-03 DIAGNOSIS — L299 Pruritus, unspecified: Secondary | ICD-10-CM | POA: Diagnosis not present

## 2023-04-03 DIAGNOSIS — R9431 Abnormal electrocardiogram [ECG] [EKG]: Secondary | ICD-10-CM | POA: Diagnosis not present

## 2023-04-03 DIAGNOSIS — L509 Urticaria, unspecified: Secondary | ICD-10-CM | POA: Diagnosis not present

## 2023-04-03 DIAGNOSIS — I959 Hypotension, unspecified: Secondary | ICD-10-CM | POA: Diagnosis not present

## 2023-04-03 NOTE — ED Notes (Signed)
Patient arrived via ems from home c/o back itching and stating I think I am having an ax rx to the pasta I ate for dinner 2 hours ago. Pt given 50mg  benadryl by ems. Patient has no visible hives no oral edema denies throat irritation and swelling airway patent.

## 2023-04-03 NOTE — ED Triage Notes (Signed)
Pt arrived via ems from home c/o back itching stating she thought she was having ax rx to pasta she ate for dinner 2 hours ago. Pt given 50mg  po benadryl by ems. Has no visible hives no oral edama throat irritation or airway issues noted

## 2023-04-04 MED ORDER — DEXAMETHASONE SODIUM PHOSPHATE 10 MG/ML IJ SOLN
10.0000 mg | Freq: Once | INTRAMUSCULAR | Status: AC
  Administered 2023-04-04: 10 mg via INTRAVENOUS
  Filled 2023-04-04: qty 1

## 2023-04-04 MED ORDER — FAMOTIDINE IN NACL 20-0.9 MG/50ML-% IV SOLN
20.0000 mg | Freq: Once | INTRAVENOUS | Status: AC
  Administered 2023-04-04: 20 mg via INTRAVENOUS
  Filled 2023-04-04: qty 50

## 2023-04-04 MED ORDER — SODIUM CHLORIDE 0.9 % IV BOLUS
1000.0000 mL | Freq: Once | INTRAVENOUS | Status: AC
  Administered 2023-04-04: 1000 mL via INTRAVENOUS

## 2023-04-04 MED ORDER — EPINEPHRINE 0.3 MG/0.3ML IJ SOAJ: 0.3000 mg | INTRAMUSCULAR | 0 refills | Status: AC | PRN

## 2023-04-04 NOTE — ED Provider Notes (Signed)
[place] EMERGENCY DEPARTMENT AT [facility] Provider Note   CSN: [id number] Arrival date & time: 04/03/23  2351     History  Chief Complaint  Patient presents with   Allergic Reaction    Arrived via ems from home c/o ax rx to pasta she ate 2 hours ago    [name] is a 22 y.o. female.  Presents to the emergency department for evaluation of allergic reaction.  Patient does have a peanut allergy.  She did not eat anything that had nuts that she knows of that has peanuts tonight, developed diffuse rash, facial swelling after eating pasta.       Home Medications Prior to Admission medications   Medication Sig Start Date End Date Taking? Authorizing Provider  EPINEPHrine 0.3 mg/0.3 mL IJ SOAJ injection Inject 0.3 mg into the muscle as needed for anaphylaxis. 04/04/23  Yes [place] Luppino, Canary Brim, MD  ferrous sulfate (FERROUSUL) 325 (65 FE) MG tablet Take 1 tablet (325 mg total) by mouth daily with breakfast. 01/18/22   Georges Mouse, NP  ZAFEMY 150-35 MCG/24HR transdermal patch APPLY 1 PATCH TOPICALLY ONCE A WEEK 07/25/22   Georges Mouse, NP      Allergies    Peanut allergen powder-dnfp and Peanut-containing drug products    Review of Systems   Review of Systems  Physical Exam Updated Vital Signs BP 116/69   Pulse 69   Temp 98.3 F (36.8 C) (Oral)   Resp 17   Ht 5' (1.524 m)   Wt 49.9 kg   LMP 12/02/2022 Comment: Pt had a miscarriage on 02/14/23  SpO2 100%   BMI 21.48 kg/m  Physical Exam Vitals and nursing note reviewed.  Constitutional:      General: She is not in acute distress.    Appearance: She is well-developed.  HENT:     Head: Normocephalic and atraumatic.     Comments: Mild periorbital edema    Mouth/Throat:     Mouth: Mucous membranes are moist.  Eyes:     General: Vision grossly intact. Gaze aligned appropriately.     Extraocular Movements: Extraocular movements intact.     Conjunctiva/sclera: Conjunctivae normal.   Cardiovascular:     Rate and Rhythm: Normal rate and regular rhythm.     Pulses: Normal pulses.     Heart sounds: Normal heart sounds, S1 normal and S2 normal. No murmur heard.    No friction rub. No gallop.  Pulmonary:     Effort: Pulmonary effort is normal. No respiratory distress.     Breath sounds: Normal breath sounds.  Abdominal:     General: Bowel sounds are normal.     Palpations: Abdomen is soft.     Tenderness: There is no abdominal tenderness. There is no guarding or rebound.     Hernia: No hernia is present.  Musculoskeletal:        General: No swelling.     Cervical back: Full passive range of motion without pain, normal range of motion and neck supple. No spinous process tenderness or muscular tenderness. Normal range of motion.     Right lower leg: No edema.     Left lower leg: No edema.  Skin:    General: Skin is warm and dry.     Capillary Refill: Capillary refill takes less than 2 seconds.     Findings: No ecchymosis, erythema, rash or wound.  Neurological:     General: No focal deficit present.     Mental  Status: She is alert and oriented to person, place, and time.     GCS: GCS eye subscore is 4. GCS verbal subscore is 5. GCS motor subscore is 6.     Cranial Nerves: Cranial nerves 2-12 are intact.     Sensory: Sensation is intact.     Motor: Motor function is intact.     Coordination: Coordination is intact.  Psychiatric:        Attention and Perception: Attention normal.        Mood and Affect: Mood normal.        Speech: Speech normal.        Behavior: Behavior normal.     ED Results / Procedures / Treatments   Labs (all labs ordered are listed, but only abnormal results are displayed) Labs Reviewed - No data to display  EKG None  Radiology No results found.  Procedures Procedures    Medications Ordered in ED Medications  sodium chloride 0.9 % bolus 1,000 mL (0 mLs Intravenous Stopped 04/04/23 0117)  dexamethasone (DECADRON) injection 10  mg (10 mg Intravenous Given 04/04/23 0031)  famotidine (PEPCID) IVPB 20 mg premix (0 mg Intravenous Stopped 04/04/23 0117)    ED Course/ Medical Decision Making/ A&P                                 Medical Decision Making Risk Prescription drug management.   Presents to the emergency department for evaluation of allergic reaction.  Patient with mild periorbital edema at arrival.  No intraoral swelling, difficulty swallowing or difficulty breathing.  Lungs clear.  Patient received Benadryl prior to arrival and the rash is resolved.  Patient monitored, has continued to do well, symptoms are resolved at this time.  Will discharge with prescription for EpiPen, continued antihistamine treatment.        Final Clinical Impression(s) / ED Diagnoses Final diagnoses:  Allergic reaction, initial encounter    Rx / DC Orders ED Discharge Orders          Ordered    EPINEPHrine 0.3 mg/0.3 mL IJ SOAJ injection  As needed        04/04/23 0240              Gilda Crease, MD 04/04/23 [phone]

## 2023-04-04 NOTE — Discharge Instructions (Addendum)
Take one Benadryl tablet every 6 hours for the next 2 days, then if needed for any itching or rash.  Follow-up with your primary doctor for possible referral to an allergist.

## 2023-04-04 NOTE — ED Notes (Signed)
Patient asleep lying on stomach easily aroused via light touch respirations even and non labored

## 2023-05-02 ENCOUNTER — Inpatient Hospital Stay (HOSPITAL_COMMUNITY)
Admission: AD | Admit: 2023-05-02 | Discharge: 2023-05-02 | Disposition: A | Discharge status: Home / Self Care | Payer: Medicaid Other | Attending: Internal Medicine | Admitting: Internal Medicine

## 2023-05-02 ENCOUNTER — Emergency Department (HOSPITAL_COMMUNITY)
Admission: EM | Admit: 2023-05-02 | Discharge: 2023-05-02 | Discharge status: Absent without leave | Payer: Medicaid Other

## 2023-05-02 ENCOUNTER — Inpatient Hospital Stay (HOSPITAL_COMMUNITY): Payer: Medicaid Other

## 2023-05-02 ENCOUNTER — Encounter (HOSPITAL_COMMUNITY): Payer: Self-pay | Admitting: Internal Medicine

## 2023-05-02 DIAGNOSIS — Z3A01 Less than 8 weeks gestation of pregnancy: Secondary | ICD-10-CM | POA: Diagnosis not present

## 2023-05-02 DIAGNOSIS — O029 Abnormal product of conception, unspecified: Secondary | ICD-10-CM | POA: Diagnosis not present

## 2023-05-02 DIAGNOSIS — Z3A00 Weeks of gestation of pregnancy not specified: Secondary | ICD-10-CM | POA: Diagnosis not present

## 2023-05-02 DIAGNOSIS — O209 Hemorrhage in early pregnancy, unspecified: Secondary | ICD-10-CM | POA: Diagnosis not present

## 2023-05-02 DIAGNOSIS — O034 Incomplete spontaneous abortion without complication: Secondary | ICD-10-CM

## 2023-05-02 LAB — ABO/RH: ABO/RH(D): O POS

## 2023-05-02 LAB — CBC
HCT: 36.1 % (ref 36.0–46.0)
Hemoglobin: 11.8 g/dL — ABNORMAL LOW (ref 12.0–15.0)
MCH: 26.8 pg (ref 26.0–34.0)
MCHC: 32.7 g/dL (ref 30.0–36.0)
MCV: 82 fL (ref 80.0–100.0)
Platelets: 222 10*3/uL (ref 150–400)
RBC: 4.4 MIL/uL (ref 3.87–5.11)
RDW: 12.2 % (ref 11.5–15.5)
WBC: 5.7 10*3/uL (ref 4.0–10.5)
nRBC: 0 % (ref 0.0–0.2)

## 2023-05-02 LAB — COMPREHENSIVE METABOLIC PANEL
ALT: 14 U/L (ref 0–44)
AST: 14 U/L — ABNORMAL LOW (ref 15–41)
Albumin: 3.4 g/dL — ABNORMAL LOW (ref 3.5–5.0)
Alkaline Phosphatase: 39 U/L (ref 38–126)
Anion gap: 10 (ref 5–15)
BUN: 9 mg/dL (ref 6–20)
CO2: 24 mmol/L (ref 22–32)
Calcium: 8.9 mg/dL (ref 8.9–10.3)
Chloride: 106 mmol/L (ref 98–111)
Creatinine, Ser: 0.76 mg/dL (ref 0.44–1.00)
GFR, Estimated: >60 mL/min (ref >60)
Glucose, Bld: 87 mg/dL (ref 70–99)
Potassium: 3.6 mmol/L (ref 3.5–5.1)
Sodium: 140 mmol/L (ref 135–145)
Total Bilirubin: 0.6 mg/dL (ref 0.3–1.2)
Total Protein: 6.7 g/dL (ref 6.5–8.1)

## 2023-05-02 LAB — HCG, QUANTITATIVE, PREGNANCY: hCG, Beta Chain, Quant, S: 23 m[IU]/mL — ABNORMAL HIGH (ref <5)

## 2023-05-02 LAB — POCT PREGNANCY, URINE: Preg Test, Ur: POSITIVE — AB

## 2023-05-02 MED ORDER — OXYCODONE-ACETAMINOPHEN 5-325 MG PO TABS
1.0000 | ORAL_TABLET | Freq: Four times a day (QID) | ORAL | 0 refills | Status: AC | PRN
Start: 2023-05-02 — End: ?

## 2023-05-02 MED ORDER — ONDANSETRON 4 MG PO TBDP: 4.0000 mg | ORAL_TABLET | Freq: Four times a day (QID) | ORAL | 0 refills | Status: DC | PRN

## 2023-05-02 MED ORDER — MISOPROSTOL 200 MCG PO TABS
800.0000 ug | ORAL_TABLET | Freq: Once | ORAL | 0 refills | Status: AC
Start: 2023-05-02 — End: 2023-05-02

## 2023-05-02 NOTE — MAU Note (Signed)
.  [name] is a 22 y.o. at Unknown here in MAU reporting: Miscarriage at the beginning of July, and has had two periods since then. Pt states she was in the shower today at 5pm and noticed a lot of bleeding and she felt something come out and what looks like a "gestational sac along with string blood clots.   Pt states she feels mild cramping. 3/10 lower abdominal.  Pt states she still continues to bleed.  Pt is currently wearing a pad.   Vitals:   05/02/23 1939  BP: 128/83  Pulse: (!) 58  Resp: 17  Temp: 97.8 F (36.6 C)  SpO2: 100%

## 2023-05-02 NOTE — MAU Provider Note (Incomplete Revision)
History     CSN: [id number]  Arrival date and time: 05/02/23 1831   Event Date/Time   First Provider Initiated Contact with Patient 05/02/23 1959      Chief Complaint  Patient presents with   Vaginal Bleeding   HPI [name] is a 22 y.o. G2P0010 who presents today for heavy vaginal bleeding. She was seen 6/26 for pelvic pain and cramping in the setting of early pregnancy. Prior to this presentation, she had a U/S done at [facility]. Based on U/S findings, pt diagnosed with nonviable pregnancy. She requested a repeat U/S which showed the same in July. She was Rx'ed Cytotec for missed Ab but reportedly never took it. She states she has had some brown discharge in the interim, but had not had any bleeding until this evening, when she was taking a shower and felt something "flop out". She was confused about what it was and given ongoing heavy VB, she presented to MAU for evaluation. She denies fevers, chills, nausea, vomiting, urinary sxs, CP, palp, dyspnea. She has mild abd cramping.   Past Medical History:  Diagnosis Date   Asthma    prn inhaler/neb.   Family history of adverse reaction to anesthesia    pt's mother has hx. of post-op N/V and hx. of being hard to wake up post-op   Fibroadenoma of breast, left    History of febrile seizure     Past Surgical History:  Procedure Laterality Date   MASS EXCISION Left 07/20/2016   Procedure: EXCISION left breast fibroadenoma;  Surgeon: Leonia Corona, MD;  Location: [facility];  Service: General;  Laterality: Left;    Family History  Problem Relation Age of Onset   Hypertension Mother    Asthma Mother    Anesthesia problems Mother        post-op N/V; hard to wake up post-op   Hypertension Maternal Uncle    Kidney disease Maternal Uncle        dialysis   Diabetes Maternal Grandmother    Heart disease Maternal Grandmother    Hypertension Maternal Grandmother    Kidney disease Maternal Grandmother         not yet on dialysis   Congestive Heart Failure Maternal Grandmother    Asthma Maternal Grandmother    Kidney disease Maternal Uncle     Social History   Tobacco Use   Smoking status: Never   Smokeless tobacco: Never  Vaping Use   Vaping status: Never Used  Substance Use Topics   Alcohol use: No   Drug use: Yes    Types: Marijuana    Allergies:  Allergies  Allergen Reactions   Peanut Allergen Powder-Dnfp Anaphylaxis   Peanut-Containing Drug Products Hives and Shortness Of Breath    Medications Prior to Admission  Medication Sig Dispense Refill Last Dose   EPINEPHrine 0.3 mg/0.3 mL IJ SOAJ injection Inject 0.3 mg into the muscle as needed for anaphylaxis. 1 each 0    ferrous sulfate (FERROUSUL) 325 (65 FE) MG tablet Take 1 tablet (325 mg total) by mouth daily with breakfast. 90 tablet 0    ZAFEMY 150-35 MCG/24HR transdermal patch APPLY 1 PATCH TOPICALLY ONCE A WEEK 9 patch 0     Review of Systems  Constitutional:  Negative for chills and fever.  Respiratory:  Negative for chest tightness and shortness of breath.   Cardiovascular:  Negative for chest pain and palpitations.  Gastrointestinal:  Negative for abdominal pain, constipation, diarrhea, nausea and vomiting.  Genitourinary:  Positive for pelvic pain and vaginal bleeding. Negative for dysuria.  Musculoskeletal:  Negative for back pain.  Neurological:  Negative for dizziness, light-headedness and headaches.   Physical Exam   Blood pressure 128/83, pulse (!) 58, temperature 97.8 F (36.6 C), temperature source Oral, resp. rate 17, height 5' (1.524 m), weight 50.8 kg, last menstrual period 12/02/2022, SpO2 100%.  Physical Exam Constitutional:      General: She is not in acute distress.    Appearance: Normal appearance. She is not ill-appearing.  HENT:     Head: Normocephalic and atraumatic.  Cardiovascular:     Rate and Rhythm: Bradycardia present.  Pulmonary:     Effort: Pulmonary effort is normal. No  respiratory distress.     Breath sounds: Normal breath sounds.  Abdominal:     General: Abdomen is flat. There is no distension.     Palpations: Abdomen is soft.     Tenderness: There is no abdominal tenderness. There is no guarding.  Musculoskeletal:        General: Normal range of motion.  Skin:    General: Skin is warm and dry.     Findings: No rash.  Neurological:     General: No focal deficit present.     Mental Status: She is alert and oriented to person, place, and time.     MAU Course  Procedures  MDM 22 y.o. G2P0010 who recently was diagnosed with missed Ab and was Rx'ed Cytotec to help evacuate POC presents today approx 2 months later with heavy vaginal bleeding and expulsion of what appears to be POC in the setting of untreated nonviable pregnancy. Suspect this is now an SAB and the tissue she saw earlier was likely GS/POC. This is less likely to be a new pregnancy per pt report and timeline of sxs also makes this less likely, but possible. She is hemodynamically and has a benign abdomen. Pt undecided on whether she would consent to blood products if needed. Will order ABO/Rh, CBC, pathology of tissue pt brought with her. Will also order U/S to eval for retained POC. If retained POC, would consider Cytotec or D&C.   9:55 PM  HgB stable, UPT was positive. Remainder of labs and U/S in process. Patient signed out to [name], CNM at 9:56 PM.   [name], MD OB Fellow, Faculty Practice [facility], Center for [facility]   Assessment and Plan

## 2023-05-02 NOTE — Discharge Instructions (Signed)

## 2023-05-02 NOTE — MAU Provider Note (Cosign Needed)
History     CSN: [id number]  Arrival date and time: 05/02/23 1831   Event Date/Time   First Provider Initiated Contact with Patient 05/02/23 1959      Chief Complaint  Patient presents with   Vaginal Bleeding   HPI [name] is a 22 y.o. G2P0010 who presents today for heavy vaginal bleeding. She was seen 6/26 for pelvic pain and cramping in the setting of early pregnancy. Prior to this presentation, she had a U/S done at [facility]. Based on U/S findings, pt diagnosed with nonviable pregnancy. She requested a repeat U/S which showed the same in July. She was Rx'ed Cytotec for missed Ab but reportedly never took it. She states she has had some brown discharge in the interim, but had not had any bleeding until this evening, when she was taking a shower and felt something "flop out". She was confused about what it was and given ongoing heavy VB, she presented to MAU for evaluation. She denies fevers, chills, nausea, vomiting, urinary sxs, CP, palp, dyspnea. She has mild abd cramping.   Past Medical History:  Diagnosis Date   Asthma    prn inhaler/neb.   Family history of adverse reaction to anesthesia    pt's mother has hx. of post-op N/V and hx. of being hard to wake up post-op   Fibroadenoma of breast, left    History of febrile seizure     Past Surgical History:  Procedure Laterality Date   MASS EXCISION Left 07/20/2016   Procedure: EXCISION left breast fibroadenoma;  Surgeon: Leonia Corona, MD;  Location: [facility];  Service: General;  Laterality: Left;    Family History  Problem Relation Age of Onset   Hypertension Mother    Asthma Mother    Anesthesia problems Mother        post-op N/V; hard to wake up post-op   Hypertension Maternal Uncle    Kidney disease Maternal Uncle        dialysis   Diabetes Maternal Grandmother    Heart disease Maternal Grandmother    Hypertension Maternal Grandmother    Kidney disease Maternal Grandmother         not yet on dialysis   Congestive Heart Failure Maternal Grandmother    Asthma Maternal Grandmother    Kidney disease Maternal Uncle     Social History   Tobacco Use   Smoking status: Never   Smokeless tobacco: Never  Vaping Use   Vaping status: Never Used  Substance Use Topics   Alcohol use: No   Drug use: Yes    Types: Marijuana    Allergies:  Allergies  Allergen Reactions   Peanut Allergen Powder-Dnfp Anaphylaxis   Peanut-Containing Drug Products Hives and Shortness Of Breath    Medications Prior to Admission  Medication Sig Dispense Refill Last Dose   EPINEPHrine 0.3 mg/0.3 mL IJ SOAJ injection Inject 0.3 mg into the muscle as needed for anaphylaxis. 1 each 0    ferrous sulfate (FERROUSUL) 325 (65 FE) MG tablet Take 1 tablet (325 mg total) by mouth daily with breakfast. 90 tablet 0    ZAFEMY 150-35 MCG/24HR transdermal patch APPLY 1 PATCH TOPICALLY ONCE A WEEK 9 patch 0     Review of Systems  Constitutional:  Negative for chills and fever.  Respiratory:  Negative for chest tightness and shortness of breath.   Cardiovascular:  Negative for chest pain and palpitations.  Gastrointestinal:  Negative for abdominal pain, constipation, diarrhea, nausea and vomiting.  Genitourinary:  Positive for pelvic pain and vaginal bleeding. Negative for dysuria.  Musculoskeletal:  Negative for back pain.  Neurological:  Negative for dizziness, light-headedness and headaches.   Physical Exam   Blood pressure 128/83, pulse (!) 58, temperature 97.8 F (36.6 C), temperature source Oral, resp. rate 17, height 5' (1.524 m), weight 50.8 kg, last menstrual period 12/02/2022, SpO2 100%.  Physical Exam Constitutional:      General: She is not in acute distress.    Appearance: Normal appearance. She is not ill-appearing.  HENT:     Head: Normocephalic and atraumatic.  Cardiovascular:     Rate and Rhythm: Bradycardia present.  Pulmonary:     Effort: Pulmonary effort is normal. No  respiratory distress.     Breath sounds: Normal breath sounds.  Abdominal:     General: Abdomen is flat. There is no distension.     Palpations: Abdomen is soft.     Tenderness: There is no abdominal tenderness. There is no guarding.  Musculoskeletal:        General: Normal range of motion.  Skin:    General: Skin is warm and dry.     Findings: No rash.  Neurological:     General: No focal deficit present.     Mental Status: She is alert and oriented to person, place, and time.     MAU Course  Procedures  MDM 22 y.o. G2P0010 who recently was diagnosed with missed Ab and was Rx'ed Cytotec to help evacuate POC presents today approx 2 months later with heavy vaginal bleeding and expulsion of what appears to be POC in the setting of untreated nonviable pregnancy. Suspect this is now an SAB and the tissue she saw earlier was likely GS/POC. This is less likely to be a new pregnancy per pt report and timeline of sxs also makes this less likely, but possible. She is hemodynamically and has a benign abdomen. Pt undecided on whether she would consent to blood products if needed. Will order ABO/Rh, CBC, pathology of tissue pt brought with her. Will also order U/S to eval for retained POC. If retained POC, would consider Cytotec or D&C.   9:55 PM  HgB stable, UPT was positive. Remainder of labs and U/S in process. Patient signed out to [name], CNM at 9:56 PM.   [name], MD OB Fellow, Faculty Practice [facility], [facility] for [facility]   Assessment and Plan  close followup Assessment and Plan  A;  Single IUP at early gestation       Incomplete abortion       Vaginal bleeding       Retained products of conception  P;  Discharge home       Rx Cytotec buccally       Rx Percocet for pain and Zofran for nausea      Follow up in office 1-2 wks Encouraged to return if she develops worsening of symptoms, increase in pain, fever, or other concerning symptoms.   Aviva Signs, CNM

## 2023-05-04 LAB — SURGICAL PATHOLOGY

## 2023-05-06 NOTE — Progress Notes (Signed)
Path showing POC.

## 2023-05-22 ENCOUNTER — Encounter: Payer: Self-pay | Admitting: Obstetrics and Gynecology

## 2023-05-22 ENCOUNTER — Other Ambulatory Visit: Payer: Self-pay

## 2023-05-22 ENCOUNTER — Ambulatory Visit: Payer: Medicaid Other | Admitting: Obstetrics and Gynecology

## 2023-05-22 VITALS — BP 111/78 | HR 65 | Ht 60.0 in | Wt 108.7 lb

## 2023-05-22 DIAGNOSIS — O039 Complete or unspecified spontaneous abortion without complication: Secondary | ICD-10-CM | POA: Diagnosis not present

## 2023-05-22 DIAGNOSIS — Z3A01 Less than 8 weeks gestation of pregnancy: Secondary | ICD-10-CM | POA: Diagnosis not present

## 2023-05-22 DIAGNOSIS — Z3045 Encounter for surveillance of transdermal patch hormonal contraceptive device: Secondary | ICD-10-CM | POA: Diagnosis not present

## 2023-05-22 DIAGNOSIS — Z309 Encounter for contraceptive management, unspecified: Secondary | ICD-10-CM | POA: Insufficient documentation

## 2023-05-22 MED ORDER — NORELGESTROMIN-ETH ESTRADIOL 150-35 MCG/24HR TD PTWK
1.0000 | MEDICATED_PATCH | TRANSDERMAL | 12 refills | Status: DC
Start: 2023-05-22 — End: 2023-06-26

## 2023-05-22 NOTE — Progress Notes (Signed)
[name] presents for f/u from SAB this past September She still has some brownish vaginal discharge Has returned to using her contraception patch Denies any bowel or bladder dysfunction  Needs pap smear  PE AF VSS Lungs clear Heart RRR Abd soft + BS GU deferred  A/P SAB  Will check BHCG today Refill contraception patch Pt to schedule appt for yearly

## 2023-05-23 LAB — BETA HCG QUANT (REF LAB): hCG Quant: <1 m[IU]/mL

## 2023-06-26 ENCOUNTER — Other Ambulatory Visit (HOSPITAL_COMMUNITY)
Admission: RE | Admit: 2023-06-26 | Discharge: 2023-06-26 | Disposition: A | Discharge status: Home / Self Care | Payer: Medicaid Other | Source: Ambulatory Visit | Attending: Family Medicine | Admitting: Family Medicine

## 2023-06-26 ENCOUNTER — Encounter: Payer: Self-pay | Admitting: Family Medicine

## 2023-06-26 ENCOUNTER — Ambulatory Visit (INDEPENDENT_AMBULATORY_CARE_PROVIDER_SITE_OTHER): Payer: Medicaid Other | Admitting: Family Medicine

## 2023-06-26 ENCOUNTER — Other Ambulatory Visit: Payer: Self-pay

## 2023-06-26 VITALS — BP 107/70 | HR 72 | Ht 60.0 in | Wt 109.6 lb

## 2023-06-26 DIAGNOSIS — Z124 Encounter for screening for malignant neoplasm of cervix: Secondary | ICD-10-CM | POA: Insufficient documentation

## 2023-06-26 DIAGNOSIS — Z3045 Encounter for surveillance of transdermal patch hormonal contraceptive device: Secondary | ICD-10-CM | POA: Diagnosis not present

## 2023-06-26 DIAGNOSIS — Z01419 Encounter for gynecological examination (general) (routine) without abnormal findings: Secondary | ICD-10-CM | POA: Diagnosis not present

## 2023-06-26 MED ORDER — NORELGESTROMIN-ETH ESTRADIOL 150-35 MCG/24HR TD PTWK: 1.0000 | MEDICATED_PATCH | TRANSDERMAL | 12 refills | Status: DC

## 2023-06-26 NOTE — Progress Notes (Signed)
ANNUAL EXAM [name] MRN [id number]  Date of birth: May 26, 2001 Chief Complaint:   Gynecologic Exam  History of Present Illness:   [name] is a 22 y.o. G17P0010 African-American female being seen today for a routine annual exam.  Current complaints: None  Patient's last menstrual period was 06/16/2023. Patient reports doing well.  Currently using patch for birth control and is tolerating this well.  Not interested in changing at this time.  The pregnancy intention screening data noted above was reviewed. Potential methods of contraception were discussed. The patient elected to proceed with No data recorded.   Last pap None. Results were: H/O abnormal pap: no     06/26/2023    1:42 PM 05/22/2023    2:04 PM 03/31/2021    5:15 PM 04/21/2019    4:10 PM  Depression screen PHQ 2/9  Decreased Interest 0 0 0 0  Down, Depressed, Hopeless 0 0 2 0  PHQ - 2 Score 0 0 2 0  Altered sleeping 0 0 3 1  Tired, decreased energy 0 0 0 1  Change in appetite 0 0 0 1  Feeling bad or failure about yourself  0 0 0 0  Trouble concentrating 0 0 0 0  Moving slowly or fidgety/restless 0 0 0 0  Suicidal thoughts 0 0  0  PHQ-9 Score 0 0 5 3  Difficult doing work/chores    Not difficult at all        06/26/2023    1:42 PM 05/22/2023    2:05 PM  GAD 7 : Generalized Anxiety Score  Nervous, Anxious, on Edge 0 0  Control/stop worrying 0 0  Worry too much - different things 0 0  Trouble relaxing 0 0  Restless 0 0  Easily annoyed or irritable 0 0  Afraid - awful might happen 0 0  Total GAD 7 Score 0 0     Review of Systems:   Pertinent items are noted in HPI Denies any headaches, blurred vision, fatigue, shortness of breath, chest pain, abdominal pain, abnormal vaginal discharge/itching/odor/irritation, problems with periods, bowel movements, urination, or intercourse unless otherwise stated above. Pertinent History Reviewed:  Reviewed past medical,surgical, social and  family history.  Reviewed problem list, medications and allergies. Physical Assessment:   Vitals:   06/26/23 1342  BP: 107/70  Pulse: 72  Weight: 109 lb 9.6 oz (49.7 kg)  Height: 5' (1.524 m)  Body mass index is 21.4 kg/m.        Physical Examination:   General appearance - well appearing, and in no distress  Mental status - alert, oriented to person, place, and time  Psych:  She has a normal mood and affect  Skin - warm and dry, normal color, no suspicious lesions noted  Chest - effort normal  Heart - normal rate and regular rhythm  Neck:  midline trachea, no thyromegaly or nodules  Abdomen - soft, nontender, nondistended, no masses or organomegaly  Pelvic - VULVA: normal appearing vulva with no masses, tenderness or lesions  VAGINA: normal appearing vagina with normal color and discharge, no lesions  CERVIX: normal appearing cervix without discharge or lesions, no CMT  Thin prep pap is done   UTERUS: uterus is felt to be normal size, shape, consistency and nontender   ADNEXA: No adnexal masses or tenderness noted.   Chaperone present for exam  No results found for this or any previous visit (from the past 24 hour(s)).  Assessment & Plan:  1) Well-Woman Exam  2) contraceptive management - Refill sent for patient's birth control patch.  Discussed other options but patient would like to continue current method.  No further questions or concerns.  Labs/procedures today: Pap  Mammogram: @ 21yo, or sooner if problems Colonoscopy: @ 21yo, or sooner if problems  No orders of the defined types were placed in this encounter.   Meds:  Meds ordered this encounter  Medications   norelgestromin-ethinyl estradiol (ZAFEMY) 150-35 MCG/24HR transdermal patch    Sig: Place 1 patch onto the skin once a week.    Dispense:  3 patch    Refill:  12    Follow-up: No follow-ups on file.  Celedonio Savage, MD 06/26/2023 2:51 PM

## 2023-07-02 LAB — CYTOLOGY - PAP
Comment: NEGATIVE
Diagnosis: HIGH — AB
High risk HPV: POSITIVE — AB

## 2023-07-03 ENCOUNTER — Encounter: Payer: Self-pay | Admitting: Family Medicine

## 2023-08-06 ENCOUNTER — Other Ambulatory Visit (HOSPITAL_COMMUNITY)
Admission: RE | Admit: 2023-08-06 | Discharge: 2023-08-06 | Disposition: A | Discharge status: Home / Self Care | Payer: Medicaid Other | Source: Ambulatory Visit | Attending: Family Medicine | Admitting: Family Medicine

## 2023-08-06 ENCOUNTER — Encounter: Payer: Self-pay | Admitting: Family Medicine

## 2023-08-06 ENCOUNTER — Ambulatory Visit: Payer: Medicaid Other | Admitting: Family Medicine

## 2023-08-06 ENCOUNTER — Other Ambulatory Visit: Payer: Self-pay

## 2023-08-06 VITALS — BP 99/61 | HR 70 | Wt 115.0 lb

## 2023-08-06 DIAGNOSIS — Z113 Encounter for screening for infections with a predominantly sexual mode of transmission: Secondary | ICD-10-CM | POA: Insufficient documentation

## 2023-08-06 DIAGNOSIS — Z1331 Encounter for screening for depression: Secondary | ICD-10-CM | POA: Diagnosis not present

## 2023-08-06 DIAGNOSIS — R87613 High grade squamous intraepithelial lesion on cytologic smear of cervix (HGSIL): Secondary | ICD-10-CM | POA: Diagnosis not present

## 2023-08-06 DIAGNOSIS — N871 Moderate cervical dysplasia: Secondary | ICD-10-CM | POA: Diagnosis not present

## 2023-08-06 DIAGNOSIS — N72 Inflammatory disease of cervix uteri: Secondary | ICD-10-CM | POA: Diagnosis not present

## 2023-08-06 LAB — POCT PREGNANCY, URINE: Preg Test, Ur: NEGATIVE

## 2023-08-06 NOTE — Progress Notes (Signed)
    GYNECOLOGY OFFICE COLPOSCOPY PROCEDURE NOTE  22 y.o. G2P0010 here for colposcopy for high-grade squamous intraepithelial neoplasia  (HGSIL-encompassing moderate and severe dysplasia) pap smear on 06/26/2023. Discussed role for HPV in cervical dysplasia, need for surveillance.  Patient gave informed written consent, time out was performed.  Placed in lithotomy position. Cervix viewed with speculum and colposcope after application of acetic acid.   Colposcopy adequate? Yes  acetowhite lesion(s) noted at SCJ; Mosaicism and punctation noted corresponding biopsies obtained.  ECC specimen obtained. All specimens were labeled and sent to pathology.  Chaperone was present during entire procedure.  Patient was given post procedure instructions.  Will follow up pathology and manage accordingly; patient will be contacted with results and recommendations.  Routine preventative health maintenance measures emphasized. Offered Gardasil.  Reva Bores, MD 08/06/2023 1:56 PM

## 2023-08-07 LAB — CERVICOVAGINAL ANCILLARY ONLY
Chlamydia: NEGATIVE
Comment: NEGATIVE
Comment: NORMAL
Neisseria Gonorrhea: NEGATIVE

## 2023-08-08 LAB — SURGICAL PATHOLOGY

## 2023-08-09 ENCOUNTER — Encounter: Payer: Self-pay | Admitting: *Deleted

## 2023-08-22 NOTE — L&D Delivery Note (Cosign Needed Addendum)
 OB/GYN Faculty Practice Delivery Note  [name] is a 23 y.o. G2P1011 s/p SVD at [redacted]w[redacted]d. She was admitted for elective IOL.   ROM: 8h 46m with clear fluid GBS Status: negative Maximum Maternal Temperature: 98.51F  Labor Progress: Patient was admitted at 1.5 cm. She was given cytotec  and FB for cervical ripening, and spontaneously ruptured her membranes. She continued in a regular contraction pattern progressing to complete after getting an epidural.  Delivery Date/Time: 9867 Delivery: Called to room and patient was complete and pushing. Head delivered Direct OA and restituted LOA. nuchal cord absent. There was a shoulder dystocia which resolved after McRobert's maneuver and suprapubic pressure were used. Shoulder and body delivered in usual fashion. Infant with spontaneous cry, placed on mother's abdomen, dried and stimulated. Cord clamped x 2 after 1-minute delay, and cut. Cord blood drawn. Placenta delivered spontaneously with gentle cord traction. Fundus intermittently boggy with massage and Pitocin . Tranexamic acid  1g and methergine  0.2mg  given. With massage blood spurting out of the vagina. Labia, perineum, vagina, and cervix inspected inspected with a second degree perineal laceration. After repair, there continued to be continued bleeding from the uterus. After obtaining the patient's permission a uterine sweep was performed with removal of clots. Even after this, there was continued uterine bleeding, therefore the decision to place a [name] was made and discussed with the patient who was agreeable. [name] placed and placed in cervical balloon. Patient monitored on the floor until bleeding through [name] tubing stabilized before transfer upstairs to postpartum.   Placenta:  spontaneous Intact Complications:shoulder dystocia Lacerations: 2nd degree EBL: Analgesia: Epidural  Newborn Data: Birth date:08/02/2023 Birth time:1:32 AM Gender:Female Living  status:Living Apgars:8 ,9  Tzphyu:6159 g          Leeroy Pouch, MD [facility] Fellow, [facility] for [facility], [facility] 08/02/2024, 3:55 AM

## 2023-12-21 LAB — OB RESULTS CONSOLE HEPATITIS B SURFACE ANTIGEN: Hepatitis B Surface Ag: NEGATIVE

## 2023-12-21 LAB — OB RESULTS CONSOLE RUBELLA ANTIBODY, IGM: Rubella: IMMUNE

## 2023-12-21 LAB — OB RESULTS CONSOLE RPR: RPR: NONREACTIVE

## 2023-12-21 LAB — OB RESULTS CONSOLE ANTIBODY SCREEN: Antibody Screen: NEGATIVE

## 2023-12-21 LAB — HEPATITIS C ANTIBODY: HCV Ab: NEGATIVE

## 2023-12-21 LAB — OB RESULTS CONSOLE HIV ANTIBODY (ROUTINE TESTING): HIV: NONREACTIVE

## 2024-07-16 ENCOUNTER — Other Ambulatory Visit (HOSPITAL_COMMUNITY)
Admission: RE | Admit: 2024-07-16 | Discharge: 2024-07-16 | Disposition: A | Source: Ambulatory Visit | Attending: Obstetrics | Admitting: Obstetrics

## 2024-07-16 ENCOUNTER — Ambulatory Visit: Admitting: Obstetrics

## 2024-07-16 ENCOUNTER — Encounter: Payer: Self-pay | Admitting: Obstetrics

## 2024-07-16 VITALS — BP 105/68 | HR 96 | Wt 160.8 lb

## 2024-07-16 DIAGNOSIS — Z3480 Encounter for supervision of other normal pregnancy, unspecified trimester: Secondary | ICD-10-CM | POA: Insufficient documentation

## 2024-07-16 DIAGNOSIS — O0933 Supervision of pregnancy with insufficient antenatal care, third trimester: Secondary | ICD-10-CM

## 2024-07-16 NOTE — Progress Notes (Signed)
 Subjective:    [name] is being seen today for her first obstetrical visit.  This is not a planned pregnancy. She is at Unknown gestation. Her obstetrical history is significant for none. Relationship with FOB: significant other, not living together. Patient does intend to breast feed. Pregnancy history fully reviewed.  The information documented in the HPI was reviewed and verified.  Menstrual History: OB History     Gravida  2   Para      Term      Preterm      AB  1   Living         SAB  1   IAB      Ectopic      Multiple      Live Births               Patient's last menstrual period was 10/23/2023.    Past Medical History:  Diagnosis Date   Anemia    Asthma    prn inhaler/neb.   Family history of adverse reaction to anesthesia    pt's mother has hx. of post-op N/V and hx. of being hard to wake up post-op   Fibroadenoma of breast, left    History of febrile seizure    Vaginal Pap smear, abnormal     Past Surgical History:  Procedure Laterality Date   MASS EXCISION Left 07/20/2016   Procedure: EXCISION left breast fibroadenoma;  Surgeon: Julietta Millman, MD;  Location: [facility];  Service: General;  Laterality: Left;    (Not in a hospital admission)  Allergies  Allergen Reactions   Peanut Allergen Powder-Dnfp Anaphylaxis   Peanut-Containing Drug Products Hives and Shortness Of Breath    Social History   Tobacco Use   Smoking status: Never   Smokeless tobacco: Never  Substance Use Topics   Alcohol use: Yes    Comment: occasionally    Family History  Problem Relation Age of Onset   Hypertension Mother    Asthma Mother    Anesthesia problems Mother        post-op N/V; hard to wake up post-op   Hypertension Maternal Uncle    Kidney disease Maternal Uncle        dialysis   Diabetes Maternal Grandmother    Heart disease Maternal Grandmother    Hypertension Maternal Grandmother    Kidney disease Maternal  Grandmother        not yet on dialysis   Congestive Heart Failure Maternal Grandmother    Asthma Maternal Grandmother    Kidney disease Maternal Uncle      Review of Systems Constitutional: negative for weight loss Gastrointestinal: negative for vomiting Genitourinary:negative for genital lesions and vaginal discharge and dysuria Musculoskeletal:negative for back pain Behavioral/Psych: negative for abusive relationship, depression, illegal drug usage and tobacco use    Objective:    BP 105/68   Pulse 96   Wt 160 lb 12.8 oz (72.9 kg)   LMP 10/23/2023   BMI 31.40 kg/m  General Appearance:    Alert, cooperative, no distress, appears stated age  Head:    Normocephalic, without obvious abnormality, atraumatic  Eyes:    PERRL, conjunctiva/corneas clear, EOM's intact, fundi    benign, both eyes  Ears:    Normal TM's and external ear canals, both ears  Nose:   Nares normal, septum midline, mucosa normal, no drainage    or sinus tenderness  Throat:   Lips, mucosa, and tongue normal; teeth and gums  normal  Neck:   Supple, symmetrical, trachea midline, no adenopathy;    thyroid :  no enlargement/tenderness/nodules; no carotid   bruit or JVD  Back:     Symmetric, no curvature, ROM normal, no CVA tenderness  Lungs:     Clear to auscultation bilaterally, respirations unlabored  Chest Wall:    No tenderness or deformity   Heart:    Regular rate and rhythm, S1 and S2 normal, no murmur, rub   or gallop  Breast Exam:    No tenderness, masses, or nipple abnormality  Abdomen:     Soft, non-tender, bowel sounds active all four quadrants,    no masses, no organomegaly  Genitalia:    Normal female without lesion, discharge or tenderness  Extremities:   Extremities normal, atraumatic, no cyanosis or edema  Pulses:   2+ and symmetric all extremities  Skin:   Skin color, texture, turgor normal, no rashes or lesions  Lymph nodes:   Cervical, supraclavicular, and axillary nodes normal  Neurologic:    CNII-XII intact, normal strength, sensation and reflexes    throughout      Lab Review Urine pregnancy test Labs reviewed yes Radiologic studies reviewed yes  Assessment:    Pregnancy at Unknown weeks    Plan:    1. Supervision of other normal pregnancy, antepartum (Primary) Rx: - Cervicovaginal ancillary only( [place]) - Culture, beta strep (group b only)  2. Late prenatal care affecting pregnancy in third trimester     Prenatal vitamins.  Counseling provided regarding continued use of seat belts, cessation of alcohol consumption, smoking or use of illicit drugs; infection precautions i.e., influenza/TDAP immunizations, toxoplasmosis,CMV, parvovirus, listeria and varicella; workplace safety, exercise during pregnancy; routine dental care, safe medications, sexual activity, hot tubs, saunas, pools, travel, caffeine use, fish and methlymercury, potential toxins, hair treatments, varicose veins Weight gain recommendations per IOM guidelines reviewed: underweight/BMI< 18.5--> gain 28 - 40 lbs; normal weight/BMI 18.5 - 24.9--> gain 25 - 35 lbs; overweight/BMI 25 - 29.9--> gain 15 - 25 lbs; obese/BMI >30->gain  11 - 20 lbs Problem list reviewed and updated. FIRST/CF mutation testing/NIPT/QUAD SCREEN/fragile X/Ashkenazi Jewish population testing/Spinal muscular atrophy discussed: results reviewed. Role of ultrasound in pregnancy discussed; fetal survey: results reviewed. Amniocentesis discussed: not indicated.   Orders Placed This Encounter  Procedures   Culture, beta strep (group b only)    Follow up in 1 weeks.  I have spent a total of 20 minutes of face-to-face time, excluding clinical staff time, reviewing notes and preparing to see patient, ordering tests and/or medications, and counseling the patient.    Courtney RONAL CENTERS, MD, FACOG Attending Obstetrician & Gynecologist, [facility], [facility], [place] 07/16/2024

## 2024-07-16 NOTE — Progress Notes (Signed)
 Pt last seen by dr at 33 weeks.   Allergy to peanuts

## 2024-07-20 LAB — CULTURE, BETA STREP (GROUP B ONLY): Strep Gp B Culture: NEGATIVE

## 2024-07-22 LAB — CERVICOVAGINAL ANCILLARY ONLY
Bacterial Vaginitis (gardnerella): NEGATIVE
Candida Glabrata: NEGATIVE
Candida Vaginitis: NEGATIVE
Chlamydia: NEGATIVE
Comment: NEGATIVE
Comment: NEGATIVE
Comment: NEGATIVE
Comment: NEGATIVE
Comment: NEGATIVE
Comment: NORMAL
Neisseria Gonorrhea: NEGATIVE
Trichomonas: NEGATIVE

## 2024-07-24 ENCOUNTER — Ambulatory Visit: Admitting: Obstetrics

## 2024-07-24 VITALS — BP 111/65 | HR 87 | Wt 166.2 lb

## 2024-07-24 DIAGNOSIS — Z3A39 39 weeks gestation of pregnancy: Secondary | ICD-10-CM | POA: Diagnosis not present

## 2024-07-24 DIAGNOSIS — Z3483 Encounter for supervision of other normal pregnancy, third trimester: Secondary | ICD-10-CM

## 2024-07-24 DIAGNOSIS — D508 Other iron deficiency anemias: Secondary | ICD-10-CM | POA: Diagnosis not present

## 2024-07-24 DIAGNOSIS — Z3480 Encounter for supervision of other normal pregnancy, unspecified trimester: Secondary | ICD-10-CM

## 2024-07-24 MED ORDER — ACCRUFER 30 MG PO CAPS: 1.0000 | ORAL_CAPSULE | Freq: Two times a day (BID) | ORAL | 3 refills | Status: DC

## 2024-07-24 NOTE — Progress Notes (Signed)
 Subjective:  [name] is a 23 y.o. G2P0010 at [redacted]w[redacted]d being seen today for ongoing prenatal care.  She is currently monitored for the following issues for this low-risk pregnancy and has Allergic rhinitis; Mild intermittent asthma without complication; CONSTIPATION, RECURRENT; ECZEMA; PITYRIASIS ROSEA; ANKLE PAIN, BILATERAL; KERATOSIS PILARIS; EPISTAXIS, RECURRENT; Fibroadenoma of breast; Contraception management; HGSIL (high grade squamous intraepithelial lesion) on Pap smear of cervix; and Supervision of other normal pregnancy, antepartum on their problem list.  Patient reports occasional contractions.  Contractions: Not present. Vag. Bleeding: None.  Movement: Present. Denies leaking of fluid.   The following portions of the patient's history were reviewed and updated as appropriate: allergies, current medications, past family history, past medical history, past social history, past surgical history and problem list. Problem list updated.  Objective:   Vitals:   07/24/24 1607  BP: 111/65  Pulse: 87  Weight: 166 lb 3.2 oz (75.4 kg)    Fetal Status: Fetal Heart Rate (bpm): 142   Movement: Present     General:  Alert, oriented and cooperative. Patient is in no acute distress.  Skin: Skin is warm and dry. No rash noted.   Cardiovascular: Normal heart rate noted  Respiratory: Normal respiratory effort, no problems with respiration noted  Abdomen: Soft, gravid, appropriate for gestational age. Pain/Pressure: Present     Pelvic:  Cervical exam performed        Extremities: Normal range of motion.  Edema: Trace  Mental Status: Normal mood and affect. Normal behavior. Normal judgment and thought content.   Urinalysis:      Assessment and Plan:  Pregnancy: G2P0010 at [redacted]w[redacted]d  1. Supervision of other normal pregnancy, antepartum (Primary)  2. Iron deficiency anemia secondary to inadequate dietary iron intake Rx: - Ferric Maltol (ACCRUFER) 30 MG CAPS; Take 1 capsule (30 mg total) by  mouth 2 (two) times daily before a meal. Take 2 hrs before, or 2 hrs after a meal.  Dispense: 60 capsule; Refill: 3  Term labor symptoms and general obstetric precautions including but not limited to vaginal bleeding, contractions, leaking of fluid and fetal movement were reviewed in detail with the patient. Please refer to After Visit Summary for other counseling recommendations.   Return in about 1 week (around 07/31/2024) for ROB.   Rudy Carlin LABOR, MD 07/25/2024

## 2024-07-24 NOTE — Progress Notes (Signed)
 Pt presents for rob. Pt needs refill on iron. No other questions or concerns at this time. Pt wants  to be checked for dilation.

## 2024-07-25 ENCOUNTER — Telehealth (HOSPITAL_COMMUNITY): Payer: Self-pay | Admitting: *Deleted

## 2024-07-25 NOTE — Telephone Encounter (Signed)
 Preadmission screen

## 2024-07-28 ENCOUNTER — Other Ambulatory Visit: Payer: Self-pay

## 2024-07-28 ENCOUNTER — Inpatient Hospital Stay (HOSPITAL_COMMUNITY)
Admission: AD | Admit: 2024-07-28 | Discharge: 2024-07-28 | Disposition: A | Discharge status: Home / Self Care | Attending: Obstetrics & Gynecology | Admitting: Obstetrics & Gynecology

## 2024-07-28 ENCOUNTER — Telehealth (HOSPITAL_COMMUNITY): Payer: Self-pay | Admitting: *Deleted

## 2024-07-28 ENCOUNTER — Encounter (HOSPITAL_COMMUNITY): Payer: Self-pay | Admitting: Obstetrics & Gynecology

## 2024-07-28 ENCOUNTER — Encounter (HOSPITAL_COMMUNITY): Payer: Self-pay | Admitting: *Deleted

## 2024-07-28 DIAGNOSIS — O471 False labor at or after 37 completed weeks of gestation: Secondary | ICD-10-CM | POA: Diagnosis not present

## 2024-07-28 DIAGNOSIS — Z3A39 39 weeks gestation of pregnancy: Secondary | ICD-10-CM | POA: Diagnosis not present

## 2024-07-28 DIAGNOSIS — Z3689 Encounter for other specified antenatal screening: Secondary | ICD-10-CM | POA: Diagnosis not present

## 2024-07-28 NOTE — MAU Note (Signed)
 [name] is a 23 y.o. at [redacted]w[redacted]d here in MAU reporting: lost mucus plug today at 11:25, also feeling some pressure.  Unsure if having contractions. Baby moving as normal, no vaginal bleeding, no loss of fluid.  Pain score: 3, back pain every now and then Vitals:   07/28/24 1250 07/28/24 1252  BP:  (!) 106/58  Pulse:  (!) 105  Resp:  18  Temp:  98.6 F (37 C)  SpO2: 99% 99%

## 2024-07-28 NOTE — MAU Provider Note (Signed)
 Chief Complaint:  lost mucus plug   HPI     [name] is a 23 y.o. G2P0010 at [redacted]w[redacted]d who presents to maternity admissions reporting that she lost her mucus plug today but denies any LOF, VB, and reports some pelvic pressure. She denies any regular ctx and reports good FM's   Pregnancy Course: [facility]  Past Medical History:  Diagnosis Date   Anemia    Asthma    prn inhaler/neb.   Family history of adverse reaction to anesthesia    pt's mother has hx. of post-op N/V and hx. of being hard to wake up post-op   Fibroadenoma of breast, left    History of febrile seizure    Vaginal Pap smear, abnormal    OB History  Gravida Para Term Preterm AB Living  2    1   SAB IAB Ectopic Multiple Live Births  1        # Outcome Date GA Lbr Len/2nd Weight Sex Type Anes PTL Lv  2 Current           1 SAB 02/2023           Past Surgical History:  Procedure Laterality Date   MASS EXCISION Left 07/20/2016   Procedure: EXCISION left breast fibroadenoma;  Surgeon: Julietta Millman, MD;  Location: [facility];  Service: General;  Laterality: Left;   Family History  Problem Relation Age of Onset   Hypertension Mother    Asthma Mother    Anesthesia problems Mother        post-op N/V; hard to wake up post-op   Hypertension Maternal Uncle    Kidney disease Maternal Uncle        dialysis   Diabetes Maternal Grandmother    Heart disease Maternal Grandmother    Hypertension Maternal Grandmother    Kidney disease Maternal Grandmother        not yet on dialysis   Congestive Heart Failure Maternal Grandmother    Asthma Maternal Grandmother    Kidney disease Maternal Uncle    Social History   Tobacco Use   Smoking status: Never   Smokeless tobacco: Never  Vaping Use   Vaping status: Never Used  Substance Use Topics   Alcohol use: Yes    Comment: occasionally   Drug use: Not Currently    Types: Marijuana    Comment: years ago   Allergies  Allergen Reactions   Peanut  Allergen Powder-Dnfp Anaphylaxis   Peanut-Containing Drug Products Hives and Shortness Of Breath   No medications prior to admission.    I have reviewed patient's Past Medical Hx, Surgical Hx, Family Hx, Social Hx, medications and allergies.   ROS  Pertinent items noted in HPI and remainder of comprehensive ROS otherwise negative.   PHYSICAL EXAM  Patient Vitals for the past 24 hrs:  BP Temp Temp src Pulse Resp SpO2 Height Weight  07/28/24 1300 -- -- -- -- -- 100 % -- --  07/28/24 1256 -- -- -- -- -- 99 % -- --  07/28/24 1255 -- -- -- -- -- 99 % -- --  07/28/24 1252 (!) 106/58 98.6 F (37 C) Oral (!) 105 18 99 % -- --  07/28/24 1250 -- -- -- -- -- 99 % 5' (1.524 m) 77.1 kg    Constitutional: Well-developed, well-nourished female in no acute distress.  Cardiovascular: normal rate & rhythm, warm and well-perfused Respiratory: normal effort, no problems with respiration noted GI: Abd soft, non-tender, gravid, no  ctx palpable MS: Extremities nontender, no edema, normal ROM Neurologic: Alert and oriented x 4.  Pelvic   Dilation: 1 Effacement (%): Thick Station: -3 Exam by:: Nat Sharps RN  Fetal Tracing: NST reactive Baseline:125-130 Variability: moderate  Accelerations: present Decelerations: absent Toco: irregular ctx's   Labs: No results found for this or any previous visit (from the past 24 hours).  Imaging:  No results found.  MDM & MAU COURSE  MDM:  MODERATE   Prenatal chart reviewed Physical exam performed NST reactive No evidence of active labor at this time OB f/u as scheduled  MAU Course: Orders Placed This Encounter  Procedures   Discharge patient Discharge disposition: 01-Home or Self Care; Discharge patient date: 07/28/2024    I have reviewed the patient chart and performed the physical exam . I have ordered & interpreted the lab results and reviewed and interpreted the NST. A/P as described below.  Counseling and education provided and patient  agreeable  with plan as described below. Verbalized understanding.    ASSESSMENT   1. False labor after 37 completed weeks of gestation   2. NST (non-stress test) reactive on fetal surveillance     PLAN  Discharge home in stable condition with return precautions.   See AVS for full description of information given to the patient including both verbal and written. Patient verbalized understanding and agrees with the plan as described above.    Follow-up Information     [facility] Follow up.   Why: If symptoms worsen or fail to resolve, As scheduled for ongoing prenatal care Contact information: [address] [place]  [id number] [phone]                Allergies as of 07/28/2024       Reactions   Peanut Allergen Powder-dnfp Anaphylaxis   Peanut-containing Drug Products Hives, Shortness Of Breath        Medication List     STOP taking these medications    norelgestromin -ethinyl estradiol  150-35 MCG/24HR transdermal patch Commonly known as: Zafemy       TAKE these medications    ACCRUFeR  30 MG Caps Generic drug: Ferric Maltol  Take 1 capsule (30 mg total) by mouth 2 (two) times daily before a meal. Take 2 hrs before, or 2 hrs after a meal.   EPINEPHrine  0.3 mg/0.3 mL Soaj injection Commonly known as: EPI-PEN Inject 0.3 mg into the muscle as needed for anaphylaxis.   Prenatal Gummies 0.18-25 MG Chew Chew 1 Dose by mouth daily.        Olam Dalton, MSN, [facility], Center for [facility]    This chart was dictated using voice recognition software, Dragon. Despite the best efforts of this provider to proofread and correct errors, errors may still occur which can change documentation meaning.

## 2024-07-28 NOTE — Telephone Encounter (Signed)
 Preadmission screen

## 2024-07-29 ENCOUNTER — Ambulatory Visit: Admitting: Obstetrics and Gynecology

## 2024-07-29 VITALS — BP 109/71 | HR 97 | Wt 169.0 lb

## 2024-07-29 DIAGNOSIS — D563 Thalassemia minor: Secondary | ICD-10-CM

## 2024-07-29 DIAGNOSIS — D649 Anemia, unspecified: Secondary | ICD-10-CM

## 2024-07-29 DIAGNOSIS — R87613 High grade squamous intraepithelial lesion on cytologic smear of cervix (HGSIL): Secondary | ICD-10-CM

## 2024-07-29 DIAGNOSIS — Z3A40 40 weeks gestation of pregnancy: Secondary | ICD-10-CM

## 2024-07-29 DIAGNOSIS — Z3480 Encounter for supervision of other normal pregnancy, unspecified trimester: Secondary | ICD-10-CM

## 2024-07-29 DIAGNOSIS — Z3483 Encounter for supervision of other normal pregnancy, third trimester: Secondary | ICD-10-CM

## 2024-07-29 NOTE — Progress Notes (Unsigned)
   PRENATAL VISIT NOTE  Subjective:  [name] is a 23 y.o. G2P0010 at [redacted]w[redacted]d being seen today for ongoing prenatal care.  She is currently monitored for the following issues for this low-risk pregnancy and has Allergic rhinitis; Mild intermittent asthma without complication; CONSTIPATION, RECURRENT; ECZEMA; PITYRIASIS ROSEA; ANKLE PAIN, BILATERAL; KERATOSIS PILARIS; EPISTAXIS, RECURRENT; Fibroadenoma of breast; Contraception management; HGSIL (high grade squamous intraepithelial lesion) on Pap smear of cervix; and Supervision of other normal pregnancy, antepartum on their problem list.  Patient reports no complaints.  Contractions: Not present. Vag. Bleeding: None.  Movement: Present. Denies leaking of fluid.   The following portions of the patient's history were reviewed and updated as appropriate: allergies, current medications, past family history, past medical history, past social history, past surgical history and problem list.   Objective:   Vitals:   07/29/24 1508  BP: 109/71  Pulse: 97  Weight: 169 lb (76.7 kg)    Fetal Status: Fetal Heart Rate (bpm): 140   Movement: Present     General:  Alert, oriented and cooperative. Patient is in no acute distress.  Skin: Skin is warm and dry. No rash noted.   Cardiovascular: Normal heart rate noted  Respiratory: Normal respiratory effort, no problems with respiration noted  Abdomen: Soft, gravid, appropriate for gestational age.  Pain/Pressure: Present      Assessment and Plan:  Pregnancy: G2P0010 at [redacted]w[redacted]d 1. Supervision of other normal pregnancy, antepartum (Primary) 2. [redacted] weeks gestation of pregnancy eIOL scheduled 12/12 Third tri HIV ordered  3. HGSIL (high grade squamous intraepithelial lesion) on Pap smear of cervix  4. Anemia PO iron Repeat CBC today  5. 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 Please refer to After Visit Summary for other counseling recommendations.   No follow-ups on file.  Future Appointments  Date Time Provider Department Center  08/01/2024 12:00 AM [facility] SCHED ROOM [facility] None    Kieth JAYSON Carolin, MD

## 2024-07-30 ENCOUNTER — Encounter: Payer: Self-pay | Admitting: Obstetrics and Gynecology

## 2024-07-30 ENCOUNTER — Ambulatory Visit: Payer: Self-pay | Admitting: Obstetrics and Gynecology

## 2024-07-30 DIAGNOSIS — D649 Anemia, unspecified: Secondary | ICD-10-CM | POA: Insufficient documentation

## 2024-07-30 DIAGNOSIS — D563 Thalassemia minor: Secondary | ICD-10-CM | POA: Insufficient documentation

## 2024-07-30 LAB — CBC
Hematocrit: 31.6 % — ABNORMAL LOW (ref 34.0–46.6)
Hemoglobin: 10.2 g/dL — ABNORMAL LOW (ref 11.1–15.9)
MCH: 25.2 pg — ABNORMAL LOW (ref 26.6–33.0)
MCHC: 32.3 g/dL (ref 31.5–35.7)
MCV: 78 fL — ABNORMAL LOW (ref 79–97)
Platelets: 186 x10E3/uL (ref 150–450)
RBC: 4.05 x10E6/uL (ref 3.77–5.28)
RDW: 18.4 % — ABNORMAL HIGH (ref 11.7–15.4)
WBC: 6.7 x10E3/uL (ref 3.4–10.8)

## 2024-07-30 LAB — HIV ANTIBODY (ROUTINE TESTING W REFLEX): HIV Screen 4th Generation wRfx: NONREACTIVE

## 2024-08-01 ENCOUNTER — Other Ambulatory Visit: Payer: Self-pay

## 2024-08-01 ENCOUNTER — Inpatient Hospital Stay (HOSPITAL_COMMUNITY): Admitting: Anesthesiology

## 2024-08-01 ENCOUNTER — Inpatient Hospital Stay (HOSPITAL_COMMUNITY)

## 2024-08-01 ENCOUNTER — Inpatient Hospital Stay (HOSPITAL_COMMUNITY)
Admission: AD | Admit: 2024-08-01 | Discharge: 2024-08-03 | DRG: 768 | Disposition: A | Attending: Family Medicine | Admitting: Family Medicine

## 2024-08-01 ENCOUNTER — Encounter (HOSPITAL_COMMUNITY): Payer: Self-pay | Admitting: Advanced Practice Midwife

## 2024-08-01 DIAGNOSIS — O9902 Anemia complicating childbirth: Secondary | ICD-10-CM | POA: Diagnosis not present

## 2024-08-01 DIAGNOSIS — Z833 Family history of diabetes mellitus: Secondary | ICD-10-CM | POA: Diagnosis not present

## 2024-08-01 DIAGNOSIS — O480 Post-term pregnancy: Secondary | ICD-10-CM | POA: Diagnosis not present

## 2024-08-01 DIAGNOSIS — Z3A40 40 weeks gestation of pregnancy: Secondary | ICD-10-CM

## 2024-08-01 DIAGNOSIS — Z8249 Family history of ischemic heart disease and other diseases of the circulatory system: Secondary | ICD-10-CM

## 2024-08-01 DIAGNOSIS — O9952 Diseases of the respiratory system complicating childbirth: Secondary | ICD-10-CM | POA: Diagnosis not present

## 2024-08-01 DIAGNOSIS — Z3490 Encounter for supervision of normal pregnancy, unspecified, unspecified trimester: Secondary | ICD-10-CM

## 2024-08-01 DIAGNOSIS — J45909 Unspecified asthma, uncomplicated: Secondary | ICD-10-CM | POA: Diagnosis not present

## 2024-08-01 LAB — CBC
HCT: 33.6 % — ABNORMAL LOW (ref 36.0–46.0)
Hemoglobin: 10.9 g/dL — ABNORMAL LOW (ref 12.0–15.0)
MCH: 25.3 pg — ABNORMAL LOW (ref 26.0–34.0)
MCHC: 32.4 g/dL (ref 30.0–36.0)
MCV: 78 fL — ABNORMAL LOW (ref 80.0–100.0)
Platelets: 152 K/uL (ref 150–400)
RBC: 4.31 MIL/uL (ref 3.87–5.11)
RDW: 19.1 % — ABNORMAL HIGH (ref 11.5–15.5)
WBC: 6.8 K/uL (ref 4.0–10.5)
nRBC: 0 % (ref 0.0–0.2)

## 2024-08-01 LAB — TYPE AND SCREEN
ABO/RH(D): O POS
Antibody Screen: NEGATIVE

## 2024-08-01 LAB — SYPHILIS: RPR W/REFLEX TO RPR TITER AND TREPONEMAL ANTIBODIES, TRADITIONAL SCREENING AND DIAGNOSIS ALGORITHM: RPR Ser Ql: NONREACTIVE

## 2024-08-01 MED ORDER — MISOPROSTOL 50MCG HALF TABLET
50.0000 ug | ORAL_TABLET | ORAL | Status: DC | PRN
  Administered 2024-08-01: 50 ug via ORAL
  Filled 2024-08-01: qty 1

## 2024-08-01 MED ORDER — ONDANSETRON HCL 4 MG/2ML IJ SOLN
4.0000 mg | Freq: Four times a day (QID) | INTRAMUSCULAR | Status: DC | PRN
  Administered 2024-08-02: 4 mg via INTRAVENOUS
  Filled 2024-08-01: qty 2

## 2024-08-01 MED ORDER — LACTATED RINGERS IV SOLN
500.0000 mL | INTRAVENOUS | Status: DC | PRN
  Administered 2024-08-01: 500 mL via INTRAVENOUS

## 2024-08-01 MED ORDER — PHENYLEPHRINE 80 MCG/ML (10ML) SYRINGE FOR IV PUSH (FOR BLOOD PRESSURE SUPPORT): 80.0000 ug | PREFILLED_SYRINGE | INTRAVENOUS | Status: DC | PRN

## 2024-08-01 MED ORDER — TERBUTALINE SULFATE 1 MG/ML IJ SOLN: 0.2500 mg | Freq: Once | INTRAMUSCULAR | Status: DC | PRN

## 2024-08-01 MED ORDER — LACTATED RINGERS IV SOLN: INTRAVENOUS | Status: DC

## 2024-08-01 MED ORDER — FENTANYL CITRATE (PF) 100 MCG/2ML IJ SOLN
INTRAMUSCULAR | Status: AC
  Filled 2024-08-01: qty 2

## 2024-08-01 MED ORDER — SOD CITRATE-CITRIC ACID 500-334 MG/5ML PO SOLN: 30.0000 mL | ORAL | Status: DC | PRN

## 2024-08-01 MED ORDER — OXYCODONE-ACETAMINOPHEN 5-325 MG PO TABS: 1.0000 | ORAL_TABLET | ORAL | Status: DC | PRN

## 2024-08-01 MED ORDER — FENTANYL CITRATE (PF) 100 MCG/2ML IJ SOLN
100.0000 ug | INTRAMUSCULAR | Status: DC | PRN
  Administered 2024-08-01 (×2): 100 ug via INTRAVENOUS
  Filled 2024-08-01: qty 2

## 2024-08-01 MED ORDER — ACETAMINOPHEN 325 MG PO TABS: 650.0000 mg | ORAL_TABLET | ORAL | Status: DC | PRN

## 2024-08-01 MED ORDER — OXYCODONE-ACETAMINOPHEN 5-325 MG PO TABS: 2.0000 | ORAL_TABLET | ORAL | Status: DC | PRN

## 2024-08-01 MED ORDER — ZOLPIDEM TARTRATE 5 MG PO TABS
5.0000 mg | ORAL_TABLET | Freq: Every evening | ORAL | Status: DC | PRN
  Administered 2024-08-01: 5 mg via ORAL
  Filled 2024-08-01: qty 1

## 2024-08-01 MED ORDER — SODIUM BICARBONATE 8.4 % IV SOLN
INTRAVENOUS | Status: DC | PRN
  Administered 2024-08-01: 3 mL via EPIDURAL

## 2024-08-01 MED ORDER — OXYTOCIN-SODIUM CHLORIDE 30-0.9 UT/500ML-% IV SOLN
2.5000 [IU]/h | INTRAVENOUS | Status: DC
  Filled 2024-08-01: qty 500

## 2024-08-01 MED ORDER — OXYTOCIN BOLUS FROM INFUSION
333.0000 mL | Freq: Once | INTRAVENOUS | Status: AC
  Administered 2024-08-02: 333 mL via INTRAVENOUS

## 2024-08-01 MED ORDER — DIPHENHYDRAMINE HCL 50 MG/ML IJ SOLN
12.5000 mg | INTRAMUSCULAR | Status: DC | PRN
  Administered 2024-08-01: 12.5 mg via INTRAVENOUS
  Filled 2024-08-01: qty 1

## 2024-08-01 MED ORDER — EPHEDRINE 5 MG/ML INJ: 10.0000 mg | INTRAVENOUS | Status: DC | PRN

## 2024-08-01 MED ORDER — LIDOCAINE HCL (PF) 1 % IJ SOLN
INTRAMUSCULAR | Status: DC | PRN
  Administered 2024-08-01: 3 mL via SUBCUTANEOUS

## 2024-08-01 MED ORDER — FENTANYL-BUPIVACAINE-NACL 0.5-0.125-0.9 MG/250ML-% EP SOLN
12.0000 mL/h | EPIDURAL | Status: DC | PRN
  Administered 2024-08-01: 10 mL/h via EPIDURAL
  Filled 2024-08-01: qty 250

## 2024-08-01 MED ORDER — LIDOCAINE HCL (PF) 1 % IJ SOLN: 30.0000 mL | INTRAMUSCULAR | Status: DC | PRN

## 2024-08-01 MED ORDER — SODIUM CHLORIDE 0.9 % IV SOLN
INTRAVENOUS | Status: DC | PRN
  Administered 2024-08-01: 5 mL via EPIDURAL

## 2024-08-01 MED ORDER — FLEET ENEMA RE ENEM: 1.0000 | ENEMA | RECTAL | Status: DC | PRN

## 2024-08-01 MED ORDER — LACTATED RINGERS IV SOLN: 500.0000 mL | Freq: Once | INTRAVENOUS | Status: DC

## 2024-08-01 NOTE — Progress Notes (Signed)
 Labor Progress Note [name] is a 23 y.o. G2P0010 at [redacted]w[redacted]d presenting for elective IOL  S: Patient sleeping after getting comfortable with epidural. Briefly introduced myself.  O:  BP 122/67   Pulse 85   Temp 98.2 F (36.8 C) (Oral)   Resp 16   Ht 5' (1.524 m)   Wt 74.6 kg   LMP 10/23/2023   SpO2 99%   BMI 32.13 kg/m  Lab Results  Component Value Date   HGB 10.9 (L) 08/01/2024    Time: 11:08 PM  FHT: baseline bpm 125, moderate variability, accelerations present, decelerations none,   Contractions: every 2 min mins,    CVE: Dilation: 5 Effacement (%): 70 Cervical Position: Middle Station: -2 Presentation: Vertex Exam by:: [place], rn   A&P: 23 y.o. G2P0010 [redacted]w[redacted]d eIOL #Labor: Latent Labor Pitocin, Cytotec , and IP Foley with SROM #Pain: Epidural #FWB: Category I #GBS negative #anemia: Hb 10.9 (chronic/other problems)  [name] [name] Pouch, MD 11:08 PM

## 2024-08-01 NOTE — Anesthesia Preprocedure Evaluation (Signed)
 Anesthesia Evaluation  Patient identified by MRN, date of birth, ID band Patient awake    Reviewed: Allergy & Precautions, NPO status , Patient's Chart, lab work & pertinent test results  History of Anesthesia Complications Negative for: history of anesthetic complications  Airway Mallampati: I  TM Distance: >3 FB Neck ROM: Full    Dental no notable dental hx. (+) Teeth Intact   Pulmonary asthma , neg sleep apnea, neg COPD, Patient abstained from smoking.Not current smoker   Pulmonary exam normal breath sounds clear to auscultation       Cardiovascular Exercise Tolerance: Good METS(-) hypertension(-) CAD and (-) Past MI negative cardio ROS (-) dysrhythmias  Rhythm:Regular Rate:Normal - Systolic murmurs    Neuro/Psych negative neurological ROS  negative psych ROS   GI/Hepatic ,neg GERD  ,,(+)     (-) substance abuse    Endo/Other  neg diabetes    Renal/GU negative Renal ROS     Musculoskeletal   Abdominal   Peds  Hematology Denies blood thinner use or bleeding disorders.    Anesthesia Other Findings Denies blood thinner use or bleeding diatheses. Recent labs reviewed. Past Medical History: 05/10/2007: Allergic rhinitis     Comment:  Centricity Description: RHINOSINUSITIS, ALLERGIC                Qualifier: Diagnosis of   By: Gladis FNP, Delorise                   Centricity Description: ALLERGIC RHINITIS WITH               CONJUNCTIVITIS  Qualifier: Diagnosis of   By: Adella               MD, Almarie SCULL SNOMED Dx Update Oct 2024   No date: Anemia No date: Asthma     Comment:  prn inhaler/neb. No date: Eczema No date: Family history of adverse reaction to anesthesia     Comment:  pt's mother has hx. of post-op N/V and hx. of being hard              to wake up post-op No date: Fibroadenoma of breast, left No date: History of febrile seizure No date: Vaginal Pap smear, abnormal    Reproductive/Obstetrics (+) Pregnancy                              Anesthesia Physical Anesthesia Plan  ASA: 2  Anesthesia Plan: Epidural   Post-op Pain Management:    Induction:   PONV Risk Score and Plan: 2 and Treatment may vary due to age or medical condition and Ondansetron   Airway Management Planned: Natural Airway  Additional Equipment:   Intra-op Plan:   Post-operative Plan:   Informed Consent: I have reviewed the patients History and Physical, chart, labs and discussed the procedure including the risks, benefits and alternatives for the proposed anesthesia with the patient or authorized representative who has indicated his/her understanding and acceptance.       Plan Discussed with: Surgeon  Anesthesia Plan Comments: (Discussed R/B/A of neuraxial anesthesia technique with patient: - rare risks of spinal/epidural hematoma, nerve damage, infection - Risk of PDPH - Risk of itching - Risk of nausea and vomiting - Risk of poor block necessitating replacement of epidural. - Risk of allergic reactions. Patient voiced understanding.)        Anesthesia Quick Evaluation

## 2024-08-01 NOTE — Progress Notes (Addendum)
 [name] is a 23 y.o. G2P0010 at [redacted]w[redacted]d by admitted for induction of labor due to Post dates. Due date 07/29/24.  Subjective: Reports very painful contractions. Plans to walk.  Objective: BP 107/66   Pulse (!) 105   Temp (!) 97 F (36.1 C) (Oral)   Resp 18   Ht 5' (1.524 m)   Wt 74.6 kg   LMP 10/23/2023   BMI 32.13 kg/m  No intake/output data recorded. No intake/output data recorded.  FHT:  FHR: 130 bpm, variability: moderate,  accelerations:  Present,  decelerations:  Absent UC:   irregular, every 1-5 minutes SVE:   Dilation: 1.5 Effacement (%): 50 Station: -2 Exam by:: Sherrell, CNM  Labs: Lab Results  Component Value Date   WBC 6.8 08/01/2024   HGB 10.9 (L) 08/01/2024   HCT 33.6 (L) 08/01/2024   MCV 78.0 (L) 08/01/2024   PLT 152 08/01/2024    Assessment / Plan: Induction of labor due to postterm.  Labor: Latent labor. Foley balloon still in place. Patient has received buccal Cytotec  x1. Plan for patient to walk, and continue with current POC.  Fetal Wellbeing:  Category I Pain Control:  IV pain meds I/D:  GBS Negative Anticipated MOD:  NSVD  Qamar Rosman L Garrie Elenes, Student-MidWife 08/01/2024, 3:58 PM

## 2024-08-01 NOTE — Anesthesia Procedure Notes (Signed)
 Epidural Patient location during procedure: OB Start time: 08/01/2024 6:00 PM End time: 08/01/2024 6:07 PM  Staffing Anesthesiologist: Boone Fess, MD Performed: anesthesiologist   Preanesthetic Checklist Completed: patient identified, IV checked, site marked, risks and benefits discussed, surgical consent, monitors and equipment checked, pre-op evaluation and timeout performed  Epidural Patient position: sitting Prep: ChloraPrep Patient monitoring: heart rate, continuous pulse ox and blood pressure Approach: midline Location: L4-L5 Injection technique: LOR saline  Needle:  Needle type: Tuohy  Needle gauge: 17 G Needle length: 9 cm Needle insertion depth: 5 cm Catheter type: closed end flexible Catheter size: 19 Gauge Catheter at skin depth: 10 cm Test dose: negative and 1.5% lidocaine  with Epi 1:200 K  Assessment Sensory level: T10 Events: blood not aspirated, no cerebrospinal fluid, injection not painful, no injection resistance, no paresthesia and negative IV test  Additional Notes First/one attempt Pt. Evaluated and documentation done after procedure finished. Patient identified. Risks/Benefits/Options discussed with patient including but not limited to bleeding, infection, nerve damage, paralysis, failed block, incomplete pain control, headache, blood pressure changes, nausea, vomiting, reactions to medication both or allergic, itching and postpartum back pain. Confirmed with bedside nurse the patient's most recent platelet count. Confirmed with patient that they are not currently taking any anticoagulation, have any bleeding history or any family history of bleeding disorders. Patient expressed understanding and wished to proceed. All questions were answered. Sterile technique was used throughout the entire procedure. Please see nursing notes for vital signs. Test dose was given through epidural catheter and negative prior to continuing to dose epidural or start infusion.  Warning signs of high block given to the patient including shortness of breath, tingling/numbness in hands, complete motor block, or any concerning symptoms with instructions to call for help. Patient was given instructions on fall risk and not to get out of bed. All questions and concerns addressed with instructions to call with any issues or inadequate analgesia.     Patient tolerated the insertion well without immediate complications.  Reason for block: procedure for painReason for block:procedure for pain

## 2024-08-01 NOTE — Progress Notes (Addendum)
 [name] is a 23 y.o. G2P0010 at [redacted]w[redacted]d admitted for an elective IOL.   Subjective: She reports that she is having some painful contractions, that were shortly relieved by Fentanyl  but that they are starting to become more painful. She does desire an epidural.   Objective: BP (!) 97/50   Pulse 88   Temp (!) 97 F (36.1 C) (Oral)   Resp 18   Ht 5' (1.524 m)   Wt 74.6 kg   LMP 10/23/2023   BMI 32.13 kg/m  No intake/output data recorded. No intake/output data recorded.  FHT:  FHR: 130 bpm, variability: moderate,  accelerations:  Present,  decelerations:  Absent UC:   Every 1-7 minutes SVE:   Dilation: 1.5 Effacement (%): 50 Station: -2 Exam by:: Sherrell, CNM  Labs: Lab Results  Component Value Date   WBC 6.8 08/01/2024   HGB 10.9 (L) 08/01/2024   HCT 33.6 (L) 08/01/2024   MCV 78.0 (L) 08/01/2024   PLT 152 08/01/2024    Assessment / Plan: Elective induction of labor. A foley balloon has been in place x 8 hours with ctx q 1-72m. Cytotec  held, for now, given patient discomfort and increasing contractions. Will continue to monitor and reassess need for Cytotec  or alternative augmentation if ctx space out. Continue routine labor support and analgesia as needed.  Labor: Foley balloon in place, increasingly painful ctx Fetal Wellbeing:  Category I Pain Control:  IV Fentanyl  100mcg at 0626 I/D:  GBS Negative Anticipated MOD:  NSVD  Raydin Bielinski L Corey Laski, Student-MidWife 08/01/2024, 10:17 AM

## 2024-08-01 NOTE — H&P (Signed)
 [name] is an 23 y.o. G66P0010 [redacted]w[redacted]d female.   Chief Complaint: post term HPI: Here for elective IOL  PNC: [facility]  OB History     Gravida  2   Para      Term      Preterm      AB  1   Living         SAB  1   IAB      Ectopic      Multiple      Live Births              Past Medical History:  Diagnosis Date   Allergic rhinitis 05/10/2007   Centricity Description: RHINOSINUSITIS, ALLERGIC  Qualifier: Diagnosis of   By: Gladis FNP, Delorise      Centricity Description: ALLERGIC RHINITIS WITH CONJUNCTIVITIS  Qualifier: Diagnosis of   By: Adella MD, Almarie SCULL SNOMED Dx Update Oct 2024     Anemia    Asthma    prn inhaler/neb.   Eczema    Family history of adverse reaction to anesthesia    pt's mother has hx. of post-op N/V and hx. of being hard to wake up post-op   Fibroadenoma of breast, left    History of febrile seizure    Vaginal Pap smear, abnormal     Past Surgical History:  Procedure Laterality Date   MASS EXCISION Left 07/20/2016   Procedure: EXCISION left breast fibroadenoma;  Surgeon: Julietta Millman, MD;  Location: [facility];  Service: General;  Laterality: Left;    Family History  Problem Relation Age of Onset   Hypertension Mother    Asthma Mother    Anesthesia problems Mother        post-op N/V; hard to wake up post-op   Hypertension Maternal Uncle    Kidney disease Maternal Uncle        dialysis   Diabetes Maternal Grandmother    Heart disease Maternal Grandmother    Hypertension Maternal Grandmother    Kidney disease Maternal Grandmother        not yet on dialysis   Congestive Heart Failure Maternal Grandmother    Asthma Maternal Grandmother    Kidney disease Maternal Uncle    Social History:  reports that she has never smoked. She has never used smokeless tobacco. She reports current alcohol use. She reports that she does not currently use drugs after having used the following drugs:  Marijuana.   Allergies[1]  Medications Prior to Admission  Medication Sig Dispense Refill   Ferric Maltol  (ACCRUFER ) 30 MG CAPS Take 1 capsule (30 mg total) by mouth 2 (two) times daily before a meal. Take 2 hrs before, or 2 hrs after a meal. 60 capsule 3   Prenatal MV & Min w/FA-DHA (PRENATAL GUMMIES) 0.18-25 MG CHEW Chew 1 Dose by mouth daily.     EPINEPHrine  0.3 mg/0.3 mL IJ SOAJ injection Inject 0.3 mg into the muscle as needed for anaphylaxis. 1 each 0     Pertinent items are noted in HPI.  Objective: Height 5' (1.524 m), weight 74.6 kg, last menstrual period 10/23/2023. General appearance: alert, cooperative, and appears stated age Head: Normocephalic, without obvious abnormality, atraumatic Neck: supple, symmetrical, trachea midline Lungs: normal effort Heart: regular rate and rhythm Abdomen: gravid, non-tender Extremities: Homans sign is negative, no sign of DVT Skin: Skin color, texture, turgor normal. No rashes or lesions Cx 1-2/50/-2/vtx  Labs: No results found for this  or any previous visit (from the past 24 hours).          ABO, Rh:    Antibody: Negative (05/02 0000)  Rubella: Immune (05/02 0000)  RPR: Nonreactive (05/02 0000)  HBsAg: Negative (05/02 0000)  HIV: Non Reactive (12/09 1603)  GBS: Negative/-- (11/26 1425)    Radiology studies: No results found.  Prenatal Transfer Tool  Maternal Diabetes: No Genetic Screening: Normal Maternal Ultrasounds/Referrals: Normal Fetal Ultrasounds or other Referrals:  None Maternal Substance Abuse:  No Significant Maternal Medications:  None Significant Maternal Lab Results: None  Assessment/Plan Principal Problem:   Pregnancy Active Problems:   Post term pregnancy over 40 weeks   #Labor:Foley inserted and inflated w/60cc H20.  Contracting too much for cytotec , will add Oral 50 mcg if ctx space out.  #Pain: plans epidural #FWB: Category 1 #ID: GBS negative #MOF: breast/bottle #MOC: patch maybe, will  decide at 6 week visit:   #Anemia: 10.2  [name] 08/01/2024, 12:23 AM      [1]  Allergies Allergen Reactions   Peanut Allergen Powder-Dnfp Anaphylaxis   Peanut-Containing Drug Products Hives and Shortness Of Breath

## 2024-08-02 ENCOUNTER — Encounter (HOSPITAL_COMMUNITY): Payer: Self-pay | Admitting: Advanced Practice Midwife

## 2024-08-02 DIAGNOSIS — Z3A40 40 weeks gestation of pregnancy: Secondary | ICD-10-CM

## 2024-08-02 DIAGNOSIS — O480 Post-term pregnancy: Secondary | ICD-10-CM

## 2024-08-02 LAB — CBC
HCT: 32.1 % — ABNORMAL LOW (ref 36.0–46.0)
Hemoglobin: 10.6 g/dL — ABNORMAL LOW (ref 12.0–15.0)
MCH: 25.1 pg — ABNORMAL LOW (ref 26.0–34.0)
MCHC: 33 g/dL (ref 30.0–36.0)
MCV: 76.1 fL — ABNORMAL LOW (ref 80.0–100.0)
Platelets: 140 K/uL — ABNORMAL LOW (ref 150–400)
RBC: 4.22 MIL/uL (ref 3.87–5.11)
RDW: 19.3 % — ABNORMAL HIGH (ref 11.5–15.5)
WBC: 9.6 K/uL (ref 4.0–10.5)
nRBC: 0 % (ref 0.0–0.2)

## 2024-08-02 MED ORDER — PRENATAL MULTIVITAMIN CH
1.0000 | ORAL_TABLET | Freq: Every day | ORAL | Status: DC
  Administered 2024-08-02: 1 via ORAL
  Filled 2024-08-02: qty 1

## 2024-08-02 MED ORDER — ONDANSETRON HCL 4 MG/2ML IJ SOLN: 4.0000 mg | INTRAMUSCULAR | Status: DC | PRN

## 2024-08-02 MED ORDER — ACETAMINOPHEN 325 MG PO TABS: 650.0000 mg | ORAL_TABLET | ORAL | Status: DC | PRN

## 2024-08-02 MED ORDER — TETANUS-DIPHTH-ACELL PERTUSSIS 5-2-15.5 LF-MCG/0.5 IM SUSP: 0.5000 mL | Freq: Once | INTRAMUSCULAR | Status: DC

## 2024-08-02 MED ORDER — OXYCODONE HCL 5 MG PO TABS: 10.0000 mg | ORAL_TABLET | ORAL | Status: DC | PRN

## 2024-08-02 MED ORDER — CEFAZOLIN SODIUM-DEXTROSE 2-4 GM/100ML-% IV SOLN
2.0000 g | Freq: Once | INTRAVENOUS | Status: AC
  Administered 2024-08-02: 2 g via INTRAVENOUS
  Filled 2024-08-02: qty 100

## 2024-08-02 MED ORDER — TRANEXAMIC ACID-NACL 1000-0.7 MG/100ML-% IV SOLN
INTRAVENOUS | Status: AC
  Administered 2024-08-02: 1000 mg
  Filled 2024-08-02: qty 100

## 2024-08-02 MED ORDER — DIPHENHYDRAMINE HCL 25 MG PO CAPS: 25.0000 mg | ORAL_CAPSULE | Freq: Four times a day (QID) | ORAL | Status: DC | PRN

## 2024-08-02 MED ORDER — SENNOSIDES-DOCUSATE SODIUM 8.6-50 MG PO TABS: 2.0000 | ORAL_TABLET | Freq: Every day | ORAL | Status: DC

## 2024-08-02 MED ORDER — WITCH HAZEL-GLYCERIN EX PADS: 1.0000 | MEDICATED_PAD | CUTANEOUS | Status: DC | PRN

## 2024-08-02 MED ORDER — ONDANSETRON HCL 4 MG PO TABS: 4.0000 mg | ORAL_TABLET | ORAL | Status: DC | PRN

## 2024-08-02 MED ORDER — DIBUCAINE (PERIANAL) 1 % EX OINT: 1.0000 | TOPICAL_OINTMENT | CUTANEOUS | Status: DC | PRN

## 2024-08-02 MED ORDER — TRANEXAMIC ACID-NACL 1000-0.7 MG/100ML-% IV SOLN: 1000.0000 mg | INTRAVENOUS | Status: DC

## 2024-08-02 MED ORDER — METHYLERGONOVINE MALEATE 0.2 MG/ML IJ SOLN
0.2000 mg | Freq: Once | INTRAMUSCULAR | Status: AC
  Administered 2024-08-02: 0.2 mg via INTRAMUSCULAR

## 2024-08-02 MED ORDER — BENZOCAINE-MENTHOL 20-0.5 % EX AERO
1.0000 | INHALATION_SPRAY | CUTANEOUS | Status: DC | PRN
  Filled 2024-08-02: qty 56

## 2024-08-02 MED ORDER — METHYLERGONOVINE MALEATE 0.2 MG/ML IJ SOLN
INTRAMUSCULAR | Status: AC
  Filled 2024-08-02: qty 1

## 2024-08-02 MED ORDER — SIMETHICONE 80 MG PO CHEW: 80.0000 mg | CHEWABLE_TABLET | ORAL | Status: DC | PRN

## 2024-08-02 MED ORDER — OXYCODONE HCL 5 MG PO TABS: 5.0000 mg | ORAL_TABLET | ORAL | Status: DC | PRN

## 2024-08-02 MED ORDER — COCONUT OIL OIL: 1.0000 | TOPICAL_OIL | Status: DC | PRN

## 2024-08-02 MED ORDER — IBUPROFEN 600 MG PO TABS
600.0000 mg | ORAL_TABLET | Freq: Four times a day (QID) | ORAL | Status: DC
  Administered 2024-08-02 – 2024-08-03 (×5): 600 mg via ORAL
  Filled 2024-08-02 (×5): qty 1

## 2024-08-02 NOTE — Progress Notes (Signed)
 Spoke with Dr. Trudy. [name] is to stay in until 0830. From there, dayshift nurse is to call first call so they can come and assess. Per provider fundal rubs can be on admission, 1 hour and q4 hours.

## 2024-08-02 NOTE — Lactation Note (Signed)
 This note was copied from a baby's chart. Lactation Consultation Note  [name] Date: 08/02/2024 Age:22 hours Reason for consult: Initial assessment;1st time breastfeeding;Term  P1, Mother states she would like to breastfeed and formula feed. Reviewed hand expression with no drops expressed at this time. Noted areola edema.  Demonstrated how to hand express and do reverse pressure softening. Provided a manual pump to prepump before latching. Attempted latching on both breasts in football hold. Baby sleepy.  Baby has been formula feeding. Suggest offering the breast first to stimulate mother's supply and hand express drops to give to baby. Feed on demand with cues.  Goal 8-12+ times per day after first 24 hrs.  Place baby STS if not cueing.  Left baby skin to skin on mother's chest.   Maternal Data Has patient been taught Hand Expression?: Yes Does the patient have breastfeeding experience prior to this delivery?: No  Feeding Mother's Current Feeding Choice: Breast Milk and Formula  LATCH Score Latch: Too sleepy or reluctant, no latch achieved, no sucking elicited.  Audible Swallowing: None  Type of Nipple: Flat  Comfort (Breast/Nipple): Soft / non-tender (Areola edema)  Hold (Positioning): Assistance needed to correctly position infant at breast and maintain latch.  LATCH Score: 4   Lactation Tools Discussed/Used Tools: Pump;Flanges Flange Size: 18 Breast pump type: Manual Pump Education: Setup, frequency, and cleaning;Milk Storage Reason for Pumping: stimulation Pumping frequency: PRN  Interventions Interventions: Breast feeding basics reviewed;Assisted with latch;Skin to skin;Hand express;Pre-pump if needed;Adjust position;Support pillows;Hand pump;Education;LC Services brochure;CDC milk storage guidelines  Discharge Pump: Personal;Hands Free (Elvie)  Consult Status Consult Status: Follow-up Date: 08/03/24 Follow-up type:  In-patient    Shannon Dines [facility] 08/02/2024, 1:45 PM

## 2024-08-02 NOTE — Discharge Summary (Signed)
 Postpartum Discharge Summary  Date of Service updated***     [name] DOB: [birth year] MRN: [id number]  Date of admission: 08/01/2024 Delivery date:08/02/2024 Delivering provider: TRUDY CZAR B Date of discharge: 08/02/2024  Admitting diagnosis: Pregnancy [Z34.90] Intrauterine pregnancy: [redacted]w[redacted]d     Secondary diagnosis:  Active Problems:   SVD (spontaneous vaginal delivery)  Additional problems: 45sec shoulder dystocia    Discharge diagnosis: Term Pregnancy Delivered                                              Post partum procedures:{Postpartum procedures:23558} Augmentation: Cytotec  and IP Foley, SROM Complications: None  Hospital course: Induction of Labor With Vaginal Delivery   23 y.o. yo G2P1011 at [redacted]w[redacted]d was admitted to the hospital 08/01/2024 for induction of labor.  Indication for induction: Elective.  Patient had an labor course complicated by 45sec shoulder dystocia, severe bleeding after delivery without meeting criteria for [facility] Membrane Rupture Time/Date: 5:00 PM,08/01/2024  Delivery Method:Vaginal, Spontaneous Operative Delivery:N/A Episiotomy: None Lacerations:  2nd degree;Perineal Details of delivery can be found in separate delivery note.  Patient had a postpartum course complicated by***. Patient is discharged home 08/02/2024.  Newborn Data: Birth date:08/02/2024 Birth time:1:32 AM Gender:Female Living status:Living Apgars:8 ,9  Weight:3840 g  Magnesium Sulfate received: No BMZ received: No Rhophylac:N/A MMR:N/A T-DaP:declined prenatally Flu: declined prenatally RSV Vaccine received: declined prenatally Transfusion:{Transfusion received:30440034}  Immunizations received: Immunization History  Administered Date(s) Administered   DTaP 07/11/2002, 10/29/2002, 01/08/2003, 10/06/2003, 04/02/2007   HIB (PRP-OMP) 07/11/2002, 10/29/2002, 10/06/2003   Hepatitis A 09/03/2009, 12/08/2011   Hepatitis B 07/11/2002, 10/29/2002,  10/06/2003   IPV 07/11/2002, 10/29/2002, 10/06/2003, 04/02/2007   MMR 05/07/2003, 04/02/2007   Meningococcal Conjugate 04/01/2014, 04/21/2019   Pneumococcal Conjugate-13 07/11/2002, 10/29/2002, 05/07/2003, 10/06/2003   Tdap 01/07/2013   Varicella 05/07/2003, 04/02/2007    Physical exam  Vitals:   08/02/24 0355 08/02/24 0400 08/02/24 0445 08/02/24 0542  BP: 116/68 119/64 110/66 (!) 108/59  Pulse: 71 79 82 87  Resp:   18 16  Temp:   98 F (36.7 C) 98.4 F (36.9 C)  TempSrc:   Oral Oral  SpO2: 99% 94% 98% 100%  Weight:      Height:       General: {Exam; general:21111117} Lochia: {Desc; appropriate/inappropriate:30686::appropriate} Uterine Fundus: {Desc; firm/soft:30687} Incision: {Exam; incision:21111123} DVT Evaluation: {Exam; icu:7888877} Labs: Lab Results  Component Value Date   WBC 9.6 08/02/2024   HGB 10.6 (L) 08/02/2024   HCT 32.1 (L) 08/02/2024   MCV 76.1 (L) 08/02/2024   PLT 140 (L) 08/02/2024      Latest Ref Rng & Units 05/02/2023    9:05 PM  CMP  Glucose 70 - 99 mg/dL 87   BUN 6 - 20 mg/dL 9   Creatinine 9.55 - 8.99 mg/dL 9.23   Sodium 864 - 854 mmol/L 140   Potassium 3.5 - 5.1 mmol/L 3.6   Chloride 98 - 111 mmol/L 106   CO2 22 - 32 mmol/L 24   Calcium 8.9 - 10.3 mg/dL 8.9   Total Protein 6.5 - 8.1 g/dL 6.7   Total Bilirubin 0.3 - 1.2 mg/dL 0.6   Alkaline Phos 38 - 126 U/L 39   AST 15 - 41 U/L 14   ALT 0 - 44 U/L 14    Edinburgh Score:     No data to  display         No data recorded  After visit meds:  Allergies as of 08/02/2024       Reactions   Peanut Allergen Powder-dnfp Anaphylaxis   Peanut-containing Drug Products Hives, Shortness Of Breath     Med Rec must be completed prior to using this [facility]***        Discharge home in stable condition Infant Feeding: Bottle and Breast Infant Disposition:home with mother Discharge instruction: per After Visit Summary and Postpartum booklet. Activity: Advance as tolerated. Pelvic  rest for 6 weeks.  Diet: routine diet Future Appointments: Future Appointments  Date Time Provider Department Center  09/11/2024  2:30 PM Constant, Winton, MD [facility] None   Follow up Visit: Message sent to [facility] 12/13  Please schedule this patient for a In person postpartum visit in 4-6 weeks with the following provider: Any provider. Additional Postpartum F/U:none  Low risk pregnancy complicated by: NA Delivery mode:  Vaginal, Spontaneous Anticipated Birth Control:  will make decision at postpartum visit   08/02/2024 [name], MD

## 2024-08-02 NOTE — Progress Notes (Signed)
 To bedside to evaluate. Pressure off of [name] device for the past half hour, no leaking around it observed. Device removed. Fundus firm at umbilicus with no blood expressed.

## 2024-08-03 ENCOUNTER — Other Ambulatory Visit (HOSPITAL_COMMUNITY): Payer: Self-pay

## 2024-08-03 MED ORDER — BENZOCAINE-MENTHOL 20-0.5 % EX AERO: 1.0000 | INHALATION_SPRAY | CUTANEOUS | Status: DC | PRN

## 2024-08-03 MED ORDER — IBUPROFEN 600 MG PO TABS
600.0000 mg | ORAL_TABLET | Freq: Four times a day (QID) | ORAL | 0 refills | Status: DC
  Filled 2024-08-03: qty 30, 8d supply, fill #0

## 2024-08-03 MED ORDER — SENNOSIDES-DOCUSATE SODIUM 8.6-50 MG PO TABS: 2.0000 | ORAL_TABLET | Freq: Every day | ORAL | Status: DC

## 2024-08-03 MED ORDER — COCONUT OIL OIL: 1.0000 | TOPICAL_OIL | Status: AC | PRN

## 2024-08-03 MED ORDER — ACETAMINOPHEN 500 MG PO TABS
1000.0000 mg | ORAL_TABLET | Freq: Four times a day (QID) | ORAL | 0 refills | Status: AC | PRN
  Filled 2024-08-03: qty 60, 8d supply, fill #0

## 2024-08-03 NOTE — Anesthesia Postprocedure Evaluation (Signed)
 Anesthesia Post Note  Patient: [name]  Procedure(s) Performed: AN AD HOC LABOR EPIDURAL     Patient location during evaluation: Mother Baby Anesthesia Type: Epidural Level of consciousness: awake, awake and alert and oriented Pain management: satisfactory to patient Vital Signs Assessment: post-procedure vital signs reviewed and stable Respiratory status: spontaneous breathing, nonlabored ventilation and respiratory function stable Cardiovascular status: blood pressure returned to baseline and stable Postop Assessment: no headache, no backache, no apparent nausea or vomiting, patient able to bend at knees, adequate PO intake and able to ambulate Anesthetic complications: no   No notable events documented.  Last Vitals:  Vitals:   08/02/24 2128 08/03/24 0554  BP: 112/72 106/65  Pulse: 85 66  Resp: 18 18  Temp: 36.6 C 36.6 C  SpO2:      Last Pain:  Vitals:   08/03/24 0725  TempSrc:   PainSc: Asleep   Pain Goal:                   [name]

## 2024-08-11 ENCOUNTER — Telehealth (HOSPITAL_COMMUNITY): Payer: Self-pay

## 2024-08-11 NOTE — Telephone Encounter (Signed)
 08/11/2024 1041  Name: [name] MRN: [id number] DOB: 2001-03-04  Reason for Call:  Transition of Care Hospital Discharge Call  Contact Status: Patient Contact Status: Message  Language assistant needed:          Follow-Up Questions:    Van Postnatal Depression Scale:  In the Past 7 Days:    PHQ2-9 Depression Scale:     Discharge Follow-up:    Post-discharge interventions: NA  Signature  Rosaline Deretha PEAK

## 2024-08-19 ENCOUNTER — Ambulatory Visit (HOSPITAL_COMMUNITY)
Admission: RE | Admit: 2024-08-19 | Discharge: 2024-08-19 | Disposition: A | Discharge status: Home / Self Care | Payer: Self-pay | Source: Ambulatory Visit | Attending: Internal Medicine | Admitting: Internal Medicine

## 2024-08-19 ENCOUNTER — Encounter (HOSPITAL_COMMUNITY): Payer: Self-pay

## 2024-08-19 VITALS — BP 106/74 | HR 80 | Temp 98.8°F | Resp 16

## 2024-08-19 DIAGNOSIS — N898 Other specified noninflammatory disorders of vagina: Secondary | ICD-10-CM | POA: Insufficient documentation

## 2024-08-19 DIAGNOSIS — N751 Abscess of Bartholin's gland: Secondary | ICD-10-CM

## 2024-08-19 DIAGNOSIS — O9089 Other complications of the puerperium, not elsewhere classified: Secondary | ICD-10-CM | POA: Diagnosis present

## 2024-08-19 DIAGNOSIS — N760 Acute vaginitis: Secondary | ICD-10-CM

## 2024-08-19 MED ORDER — DOXYCYCLINE HYCLATE 100 MG PO CAPS: 100.0000 mg | ORAL_CAPSULE | Freq: Two times a day (BID) | ORAL | 0 refills | Status: DC

## 2024-08-19 MED ORDER — HYDROCODONE-ACETAMINOPHEN 5-325 MG PO TABS: 1.0000 | ORAL_TABLET | Freq: Four times a day (QID) | ORAL | 0 refills | Status: DC | PRN

## 2024-08-19 MED ORDER — FLUCONAZOLE 150 MG PO TABS: 150.0000 mg | ORAL_TABLET | ORAL | 0 refills | Status: DC

## 2024-08-19 NOTE — ED Notes (Addendum)
 [name]

## 2024-08-19 NOTE — ED Triage Notes (Signed)
 Patient here today with c/o vaginal itching X 2 days. Patient gave birth 2 weeks ago.   Patient is also c/o an abscess on her left labia X 4 days.

## 2024-08-19 NOTE — ED Provider Notes (Signed)
 " [facility]    CSN: [id number] Arrival date & time: 08/19/24  9155      History   Chief Complaint Chief Complaint  Patient presents with   Vaginal Itching    I'm 2 weeks postpartum and been having severe itching for a few days. - Entered by patient   Abscess    HPI [name] is a 23 y.o. female.   [name] is a 23 y.o. female who is 2 weeks status post spontaneous vaginal delivery presenting for chief complaint of vaginal itching, vaginal/pelvic pain, and possible left labial abscess. She first noticed possible left labial abscess 4 days ago when she noticed pain, swelling, and a firm knot to the left labia majora. She complains of intense itching to the upper vulva without vaginal bleeding. States lochia has become more white/yellow in color and she is experiencing minimal vaginal bleeding.  She recently switched from always brand to equate brand pads and wonders if itching is due to an allergic reaction to new type of pad/irritation from her pads.  Denies vaginal odor, fever, chills, nausea, vomiting, dysuria, urinary frequency, urinary urgency, gross hematuria, constipation, diarrhea, blood/mucus in the stools, dizziness, flank pain, and low back pain.  No persistent vaginal bleeding.  She did use a clean razor to shave her pubic hair a few days before arrival to the hospital to give birth, she does not remember cutting her skin or causing abrasion.   She did have a second-degree vaginal laceration which was repaired using sutures at time of delivery.  Additionally, her child was born with shoulder dystocia and she was required to use a Jayda device for postpartum hemorrhage.  She did not require blood transfusion after delivery.  She believes she was given antibiotics in the hospital before she was discharged, denies other recent antibiotic use to her knowledge in the last few weeks.  She has not been sexually active since delivery and denies chance of STD.   She has not been sexually active since September 2025 and has had negative testing since.     Vaginal Itching  Abscess   Past Medical History:  Diagnosis Date   Allergic rhinitis 05/10/2007   Centricity Description: RHINOSINUSITIS, ALLERGIC  Qualifier: Diagnosis of   By: Gladis FNP, Delorise      Centricity Description: ALLERGIC RHINITIS WITH CONJUNCTIVITIS  Qualifier: Diagnosis of   By: Adella MD, Almarie SCULL SNOMED Dx Update Oct 2024     Anemia    Asthma    prn inhaler/neb.   Eczema    Family history of adverse reaction to anesthesia    pt's mother has hx. of post-op N/V and hx. of being hard to wake up post-op   Fibroadenoma of breast, left    History of febrile seizure    Vaginal Pap smear, abnormal     Patient Active Problem List   Diagnosis Date Noted   Postpartum hemorrhage 08/03/2024   SVD (spontaneous vaginal delivery) 08/02/2024   Alpha thalassemia silent carrier 07/30/2024   Anemia 07/30/2024   Supervision of other normal pregnancy, antepartum 07/16/2024   HGSIL (high grade squamous intraepithelial lesion) on Pap smear of cervix 08/06/2023   Fibroadenoma of breast 11/11/2015   PITYRIASIS ROSEA 11/04/2009    Past Surgical History:  Procedure Laterality Date   MASS EXCISION Left 07/20/2016   Procedure: EXCISION left breast fibroadenoma;  Surgeon: Julietta Millman, MD;  Location: [facility];  Service: General;  Laterality:  Left;    OB History     Gravida  2   Para  1   Term  1   Preterm      AB  1   Living  1      SAB  1   IAB      Ectopic      Multiple  0   Live Births  1            Home Medications    Prior to Admission medications  Medication Sig Start Date End Date Taking? Authorizing Provider  doxycycline  (VIBRAMYCIN ) 100 MG capsule Take 1 capsule (100 mg total) by mouth 2 (two) times daily. 08/19/24  Yes Enedelia Dorna HERO, FNP  fluconazole  (DIFLUCAN ) 150 MG tablet Take 1 tablet  (150 mg total) by mouth every 3 (three) days. 08/19/24  Yes Enedelia Dorna HERO, FNP  HYDROcodone -acetaminophen  (NORCO/VICODIN) 5-325 MG tablet Take 1 tablet by mouth every 6 (six) hours as needed. 08/19/24  Yes Enedelia Dorna HERO, FNP  acetaminophen  (TYLENOL ) 500 MG tablet Take 2 tablets (1,000 mg total) by mouth every 6 (six) hours as needed for mild pain (pain score 1-3) or moderate pain (pain score 4-6). 08/03/24   Trudy Leeroy NOVAK, MD  benzocaine -Menthol  (DERMOPLAST) 20-0.5 % AERO Apply 1 Application topically as needed for irritation (perineal discomfort). 08/03/24   Williams, Ellen B, MD  coconut oil OIL Apply 1 Application topically as needed. 08/03/24   Trudy Leeroy NOVAK, MD  EPINEPHrine  0.3 mg/0.3 mL IJ SOAJ injection Inject 0.3 mg into the muscle as needed for anaphylaxis. 04/04/23   Haze Lonni PARAS, MD  Ferric Maltol  (ACCRUFER ) 30 MG CAPS Take 1 capsule (30 mg total) by mouth 2 (two) times daily before a meal. Take 2 hrs before, or 2 hrs after a meal. 07/24/24   Rudy Carlin LABOR, MD  ibuprofen  (ADVIL ) 600 MG tablet Take 1 tablet (600 mg total) by mouth every 6 (six) hours. 08/03/24   Trudy Leeroy NOVAK, MD  Prenatal MV & Min w/FA-DHA (PRENATAL GUMMIES) 0.18-25 MG CHEW Chew 1 Dose by mouth daily.    [provider]    Family History Family History  Problem Relation Age of Onset   Hypertension Mother    Asthma Mother    Anesthesia problems Mother        post-op N/V; hard to wake up post-op   Hypertension Maternal Uncle    Kidney disease Maternal Uncle        dialysis   Diabetes Maternal Grandmother    Heart disease Maternal Grandmother    Hypertension Maternal Grandmother    Kidney disease Maternal Grandmother        not yet on dialysis   Congestive Heart Failure Maternal Grandmother    Asthma Maternal Grandmother    Kidney disease Maternal Uncle     Social History Social History[1]   Allergies   Peanut allergen powder-dnfp and  Peanut-containing drug products   Review of Systems Review of Systems Per HPI  Physical Exam Triage Vital Signs ED Triage Vitals  Encounter Vitals Group     BP 08/19/24 0911 106/74     Girls Systolic BP Percentile --      Girls Diastolic BP Percentile --      Boys Systolic BP Percentile --      Boys Diastolic BP Percentile --      Pulse Rate 08/19/24 0911 80     Resp 08/19/24 0911 16     Temp 08/19/24 0911  98.8 F (37.1 C)     Temp Source 08/19/24 0911 Oral     SpO2 08/19/24 0911 98 %     Weight --      Height --      Head Circumference --      Peak Flow --      Pain Score 08/19/24 0908 8     Pain Loc --      Pain Education --      Exclude from Growth Chart --    No data found.  Updated Vital Signs BP 106/74 (BP Location: Right Arm)   Pulse 80   Temp 98.8 F (37.1 C) (Oral)   Resp 16   LMP 10/23/2023 (Approximate)   SpO2 98%   Breastfeeding No   Visual Acuity Right Eye Distance:   Left Eye Distance:   Bilateral Distance:    Right Eye Near:   Left Eye Near:    Bilateral Near:     Physical Exam Vitals and nursing note reviewed. Exam conducted with a chaperone present Horris RN present for GU exam).  Constitutional:      Appearance: She is not ill-appearing or toxic-appearing.  HENT:     Head: Normocephalic and atraumatic.     Right Ear: Hearing and external ear normal.     Left Ear: Hearing and external ear normal.     Nose: Nose normal.     Mouth/Throat:     Lips: Pink.  Eyes:     General: Lids are normal. Vision grossly intact. Gaze aligned appropriately.     Extraocular Movements: Extraocular movements intact.     Conjunctiva/sclera: Conjunctivae normal.  Pulmonary:     Effort: Pulmonary effort is normal.  Abdominal:     General: Bowel sounds are normal.     Palpations: Abdomen is soft.     Tenderness: There is no abdominal tenderness. There is no right CVA tenderness, left CVA tenderness or guarding.     Hernia: There is no hernia in the  right inguinal area.  Genitourinary:    Exam position: Knee-chest position.     Labia:        Right: No rash, tenderness, lesion or injury.        Left: Tenderness and lesion present. No rash or injury.      Urethra: No prolapse, urethral pain, urethral swelling or urethral lesion.     Comments: 3cm by 2cm bartholin's gland abscess to the left labia majora, tender to palpation, non-fluctuant, non-draining. No other lesions to the vulva. Copious thin white/yellow vaginal discharge with odor present to the vaginal introitus. No signs of skin excoriation from itching.  Musculoskeletal:     Cervical back: Neck supple.  Lymphadenopathy:     Lower Body: No right inguinal adenopathy. Left inguinal adenopathy present.  Skin:    General: Skin is warm and dry.     Capillary Refill: Capillary refill takes less than 2 seconds.     Findings: No rash.  Neurological:     General: No focal deficit present.     Mental Status: She is alert and oriented to person, place, and time. Mental status is at baseline.     Cranial Nerves: No dysarthria or facial asymmetry.  Psychiatric:        Mood and Affect: Mood normal.        Speech: Speech normal.        Behavior: Behavior normal.        Thought Content: Thought content normal.  Judgment: Judgment normal.      UC Treatments / Results  Labs (all labs ordered are listed, but only abnormal results are displayed) Labs Reviewed  CERVICOVAGINAL ANCILLARY ONLY    EKG   Radiology No results found.  Procedures Procedures (including critical care time)  Medications Ordered in UC Medications - No data to display  Initial Impression / Assessment and Plan / UC Course  I have reviewed the triage vital signs and the nursing notes.  Pertinent labs & imaging results that were available during my care of the patient were reviewed by me and considered in my medical decision making (see chart for details).   1. Bartholin's gland abscess, vaginal  discharge, vaginal itching, acute vaginitis, 2 weeks postpartum   *** Final Clinical Impressions(s) / UC Diagnoses   Final diagnoses:  Bartholin's gland abscess  Vaginal discharge  Vaginal itching  Acute vaginitis     Discharge Instructions      Your abscess has been evaluated in the clinic and appears to be infected, but is not quite ready for drainage.   I would like for you to perform warm compresses to the area frequently and continue taking over the counter medications for pain and inflammation as directed.   Start taking antibiotic sent to pharmacy as directed. This will help reduce infection to area and help the abscess mature/drain.   If abscess does not begin to drain on its own over the next few days and becomes softer, please return to urgent care for this to be drained appropriately.   You may take ibuprofen  as needed for pain. You may take 1 pill of hydrocodone -acetaminophen  every 6 hours as needed for pain that is not well-controlled with use of ibuprofen .  Once your pain is well-controlled with hydrocodone -acetaminophen , switch back to using only ibuprofen .  Take tylenol  500mg  every 6 hours as needed for pain.   Do not drive, drink alcohol, or go to work while taking strong pain medicine (hydrocodone -acetaminophen ). This medication will make you sleepy.   If you have any fever, chills, nausea, vomiting, or worsening pain/swelling to the area, please return to urgent care for reevaluation.      ED Prescriptions     Medication Sig Dispense Auth. Provider   doxycycline  (VIBRAMYCIN ) 100 MG capsule Take 1 capsule (100 mg total) by mouth 2 (two) times daily. 20 capsule Shara Hartis M, FNP   HYDROcodone -acetaminophen  (NORCO/VICODIN) 5-325 MG tablet Take 1 tablet by mouth every 6 (six) hours as needed. 10 tablet Enedelia Dorna HERO, FNP   fluconazole  (DIFLUCAN ) 150 MG tablet Take 1 tablet (150 mg total) by mouth every 3 (three) days. 2 tablet Enedelia Dorna HERO, FNP      I have reviewed the PDMP during this encounter.       [1] Social History Tobacco Use   Smoking status: Never   Smokeless tobacco: Never  Vaping Use   Vaping status: Never Used  Substance Use Topics   Alcohol use: Not Currently    Comment: occasionally   Drug use: Not Currently    Types: Marijuana    Comment: years ago  "

## 2024-08-19 NOTE — Discharge Instructions (Addendum)
 Your abscess has been evaluated in the clinic and appears to be infected, but is not quite ready for drainage.   I would like for you to perform warm compresses to the area frequently and continue taking over the counter medications for pain and inflammation as directed.   Start taking antibiotic sent to pharmacy as directed. This will help reduce infection to area and help the abscess mature/drain.   If abscess does not begin to drain on its own over the next few days and becomes softer, please return to urgent care for this to be drained appropriately.   You may take ibuprofen  as needed for pain. You may take 1 pill of hydrocodone -acetaminophen  every 6 hours as needed for pain that is not well-controlled with use of ibuprofen .  Once your pain is well-controlled with hydrocodone -acetaminophen , switch back to using only ibuprofen .  Take tylenol  500mg  every 6 hours as needed for pain.   Do not drive, drink alcohol, or go to work while taking strong pain medicine (hydrocodone -acetaminophen ). This medication will make you sleepy.   If you have any fever, chills, nausea, vomiting, or worsening pain/swelling to the area, please return to urgent care for reevaluation.

## 2024-08-20 ENCOUNTER — Ambulatory Visit (HOSPITAL_COMMUNITY): Payer: Self-pay

## 2024-08-20 LAB — CERVICOVAGINAL ANCILLARY ONLY
Bacterial Vaginitis (gardnerella): POSITIVE — AB
Candida Glabrata: NEGATIVE
Candida Vaginitis: NEGATIVE
Comment: NEGATIVE
Comment: NEGATIVE
Comment: NEGATIVE

## 2024-08-20 MED ORDER — METRONIDAZOLE 500 MG PO TABS: 500.0000 mg | ORAL_TABLET | Freq: Two times a day (BID) | ORAL | 0 refills | Status: AC

## 2024-09-11 ENCOUNTER — Encounter: Payer: Self-pay | Admitting: Obstetrics and Gynecology

## 2024-09-11 ENCOUNTER — Ambulatory Visit: Admitting: Obstetrics and Gynecology

## 2024-09-11 MED ORDER — NORELGESTROMIN-ETH ESTRADIOL 150-35 MCG/24HR TD PTWK: 1.0000 | MEDICATED_PATCH | TRANSDERMAL | 12 refills | Status: AC

## 2024-09-11 NOTE — Progress Notes (Signed)
 "   Post Partum Visit Note  [name] is a 24 y.o. G17P1011 female who presents for a postpartum visit. She is 5 weeks postpartum following a normal spontaneous vaginal delivery.  I have fully reviewed the prenatal and intrapartum course. The delivery was at 41 gestational weeks.  Anesthesia: epidural. Postpartum course has been very good. Baby is doing wellvery good. Baby is feeding by bottle - Bubs Formula. Bleeding thick, heavy lochia and red. Bowel function is normal. Bladder function is normal. Patient is not sexually active. Contraception method is abstinence. Postpartum depression screening: negative.   The pregnancy intention screening data noted above was reviewed. Potential methods of contraception were discussed.   Edinburgh Postnatal Depression Scale - 09/11/24 1440       Edinburgh Postnatal Depression Scale:  In the Past 7 Days   I have been able to laugh and see the funny side of things. 0    I have looked forward with enjoyment to things. 0    I have blamed myself unnecessarily when things went wrong. 0    I have been anxious or worried for no good reason. 0    I have felt scared or panicky for no good reason. 0    Things have been getting on top of me. 0    I have been so unhappy that I have had difficulty sleeping. 0    I have felt sad or miserable. 0    I have been so unhappy that I have been crying. 0    The thought of harming myself has occurred to me. 0    Edinburgh Postnatal Depression Scale Total 0          Health Maintenance Due  Topic Date Due   COVID-19 Vaccine (1) Never done   HPV VACCINES (1 - Risk 3-dose series) Never done   Meningococcal B Vaccine (1 of 2 - Standard) Never done   DTaP/Tdap/Td (7 - Td or Tdap) 01/08/2023   Influenza Vaccine  Never done       Review of Systems Pertinent items noted in HPI and remainder of comprehensive ROS otherwise negative.  Objective:  BP 98/61   Pulse 86   Ht 5' (1.524 m)   Wt 132 lb (59.9 kg)    LMP 09/08/2024 (Exact Date)   Breastfeeding No   BMI 25.78 kg/m    General:  alert, cooperative, and no distress   Breasts:  normal  Lungs: clear to auscultation bilaterally  Heart:  regular rate and rhythm  Abdomen: soft, non-tender; bowel sounds normal; no masses,  no organomegaly   Wound  N/A  GU exam:  Patient declined due to vaginal bleeding       Assessment:   Normal postpartum exam.   Plan:   Essential components of care per ACOG recommendations:  1.  Mood and well being: Patient with negative depression screening today. Patient reports receiving ample help from 3 grandmothers - Patient tobacco use? No.   - hx of drug use? No.    2. Infant care and feeding:  -Patient currently breastmilk feeding? No.  -Social determinants of health (SDOH) reviewed in EPIC. 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 Sexuality, contraception and birth spacing - Patient does not want a pregnancy in the next year.  Desired family size is 2 children.  - Reviewed reproductive life planning. Reviewed contraceptive methods based on pt preferences and effectiveness.  Patient desired Contraceptive Patch  today.   - Discussed birth spacing of 18 months  4. Sleep and fatigue -Encouraged family/partner/community support of 4 hrs of uninterrupted sleep to help with mood and fatigue  5. Physical Recovery  - Discussed patients delivery and complications. She describes her labor as good. - Patient had a vaginal delivery complicated by a shoulder dystocia. Patient had a 2nd degree laceration. Perineal healing reviewed. Patient expressed understanding - Patient has urinary incontinence? No. - Patient is safe to resume physical and sexual activity  6.  Health Maintenance - HM due items addressed Yes - Last pap smear  Diagnosis  Date Value Ref Range Status  06/26/2023 (A)  Final   - High grade squamous intraepithelial lesion (HSIL)   Pap smear not done at  today's visit as patient reports heavy vaginal bleeding. Patient agrees to return within the next 4 weeks for pap smear -Breast Cancer screening indicated? No.   7. Chronic Disease/Pregnancy Condition follow up: None  - PCP follow up   [facility] [employer]  "

## 2024-10-15 ENCOUNTER — Ambulatory Visit: Payer: Self-pay | Admitting: Obstetrics & Gynecology
